# Patient Record
Sex: Female | Born: 1970 | Race: White | Hispanic: No | Marital: Married | Smoking: Former smoker
Health system: Southern US, Academic
[De-identification: ages and names within clinical notes are randomized; demographics above are authoritative.]

## PROBLEM LIST (undated history)

## (undated) DIAGNOSIS — F419 Anxiety disorder, unspecified: Secondary | ICD-10-CM

## (undated) DIAGNOSIS — E509 Vitamin A deficiency, unspecified: Secondary | ICD-10-CM

## (undated) DIAGNOSIS — J309 Allergic rhinitis, unspecified: Secondary | ICD-10-CM

## (undated) DIAGNOSIS — I10 Essential (primary) hypertension: Secondary | ICD-10-CM

## (undated) DIAGNOSIS — Z889 Allergy status to unspecified drugs, medicaments and biological substances status: Secondary | ICD-10-CM

## (undated) DIAGNOSIS — F329 Major depressive disorder, single episode, unspecified: Secondary | ICD-10-CM

## (undated) DIAGNOSIS — G43909 Migraine, unspecified, not intractable, without status migrainosus: Secondary | ICD-10-CM

## (undated) DIAGNOSIS — F32A Depression, unspecified: Secondary | ICD-10-CM

## (undated) DIAGNOSIS — E785 Hyperlipidemia, unspecified: Secondary | ICD-10-CM

## (undated) HISTORY — DX: Vitamin a deficiency, unspecified: E50.9

## (undated) HISTORY — DX: Essential (primary) hypertension: I10

## (undated) HISTORY — DX: Hyperlipidemia, unspecified: E78.5

## (undated) HISTORY — DX: Depression, unspecified: F32.A

## (undated) HISTORY — DX: Allergy status to unspecified drugs, medicaments and biological substances: Z88.9

## (undated) HISTORY — DX: Migraine, unspecified, not intractable, without status migrainosus: G43.909

## (undated) HISTORY — PX: HX TUBAL LIGATION: SHX77

## (undated) HISTORY — PX: HX APPENDECTOMY: SHX54

## (undated) HISTORY — PX: HX TONSILLECTOMY: SHX27

## (undated) HISTORY — DX: Allergic rhinitis, unspecified: J30.9

## (undated) HISTORY — DX: Anxiety disorder, unspecified: F41.9

---
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
## 2000-12-05 ENCOUNTER — Inpatient Hospital Stay (HOSPITAL_COMMUNITY): Payer: Self-pay

## 2014-11-17 DIAGNOSIS — E559 Vitamin D deficiency, unspecified: Secondary | ICD-10-CM | POA: Insufficient documentation

## 2014-12-24 DIAGNOSIS — F411 Generalized anxiety disorder: Secondary | ICD-10-CM | POA: Insufficient documentation

## 2015-08-17 DIAGNOSIS — F17200 Nicotine dependence, unspecified, uncomplicated: Secondary | ICD-10-CM | POA: Insufficient documentation

## 2015-08-17 DIAGNOSIS — J309 Allergic rhinitis, unspecified: Secondary | ICD-10-CM | POA: Insufficient documentation

## 2015-12-11 DIAGNOSIS — R059 Cough, unspecified: Secondary | ICD-10-CM | POA: Insufficient documentation

## 2016-05-22 DIAGNOSIS — A6920 Lyme disease, unspecified: Secondary | ICD-10-CM | POA: Insufficient documentation

## 2018-06-15 DIAGNOSIS — J22 Unspecified acute lower respiratory infection: Secondary | ICD-10-CM | POA: Insufficient documentation

## 2018-09-17 ENCOUNTER — Ambulatory Visit (HOSPITAL_BASED_OUTPATIENT_CLINIC_OR_DEPARTMENT_OTHER): Payer: Self-pay | Admitting: Rheumatology

## 2018-09-17 ENCOUNTER — Encounter (HOSPITAL_BASED_OUTPATIENT_CLINIC_OR_DEPARTMENT_OTHER): Payer: Self-pay

## 2018-09-23 ENCOUNTER — Encounter (INDEPENDENT_AMBULATORY_CARE_PROVIDER_SITE_OTHER): Payer: Self-pay

## 2018-12-26 DIAGNOSIS — R5383 Other fatigue: Secondary | ICD-10-CM | POA: Insufficient documentation

## 2018-12-26 DIAGNOSIS — E785 Hyperlipidemia, unspecified: Secondary | ICD-10-CM | POA: Insufficient documentation

## 2018-12-30 ENCOUNTER — Encounter (INDEPENDENT_AMBULATORY_CARE_PROVIDER_SITE_OTHER): Payer: Self-pay

## 2019-05-14 DIAGNOSIS — M545 Low back pain, unspecified: Secondary | ICD-10-CM | POA: Insufficient documentation

## 2019-07-24 ENCOUNTER — Other Ambulatory Visit (HOSPITAL_COMMUNITY): Payer: Self-pay | Admitting: Family Medicine

## 2019-07-30 ENCOUNTER — Encounter (INDEPENDENT_AMBULATORY_CARE_PROVIDER_SITE_OTHER): Payer: Self-pay | Admitting: NURSE PRACTITIONER-ADULT HEALTH

## 2019-07-30 DIAGNOSIS — F32A Depression, unspecified: Secondary | ICD-10-CM | POA: Insufficient documentation

## 2019-07-30 DIAGNOSIS — F419 Anxiety disorder, unspecified: Secondary | ICD-10-CM | POA: Insufficient documentation

## 2019-07-30 DIAGNOSIS — F329 Major depressive disorder, single episode, unspecified: Secondary | ICD-10-CM | POA: Insufficient documentation

## 2019-08-07 ENCOUNTER — Encounter (HOSPITAL_BASED_OUTPATIENT_CLINIC_OR_DEPARTMENT_OTHER): Payer: Self-pay | Admitting: Rheumatology

## 2019-09-15 ENCOUNTER — Encounter (INDEPENDENT_AMBULATORY_CARE_PROVIDER_SITE_OTHER): Payer: Self-pay | Admitting: NURSE PRACTITIONER-ADULT HEALTH

## 2019-09-29 ENCOUNTER — Encounter (HOSPITAL_BASED_OUTPATIENT_CLINIC_OR_DEPARTMENT_OTHER): Payer: Self-pay | Admitting: Rheumatology

## 2019-10-30 ENCOUNTER — Encounter (INDEPENDENT_AMBULATORY_CARE_PROVIDER_SITE_OTHER): Payer: Self-pay | Admitting: NURSE PRACTITIONER-ADULT HEALTH

## 2019-11-06 ENCOUNTER — Other Ambulatory Visit: Payer: Self-pay

## 2019-11-06 ENCOUNTER — Ambulatory Visit: Payer: No Typology Code available for payment source | Attending: Rheumatology | Admitting: Rheumatology

## 2019-11-06 ENCOUNTER — Encounter (HOSPITAL_BASED_OUTPATIENT_CLINIC_OR_DEPARTMENT_OTHER): Payer: Self-pay | Admitting: Rheumatology

## 2019-11-06 VITALS — BP 117/68 | HR 104 | Ht 60.0 in | Wt 170.4 lb

## 2019-11-06 DIAGNOSIS — Z79899 Other long term (current) drug therapy: Secondary | ICD-10-CM | POA: Insufficient documentation

## 2019-11-06 DIAGNOSIS — M797 Fibromyalgia: Secondary | ICD-10-CM | POA: Insufficient documentation

## 2019-11-06 DIAGNOSIS — M1990 Unspecified osteoarthritis, unspecified site: Secondary | ICD-10-CM | POA: Insufficient documentation

## 2019-11-06 DIAGNOSIS — M2550 Pain in unspecified joint: Secondary | ICD-10-CM

## 2019-11-06 DIAGNOSIS — R5383 Other fatigue: Secondary | ICD-10-CM

## 2019-11-06 NOTE — Nursing Note (Signed)
Meds checked with patients list and confirmed correct pharmacy with patient for e-scribing.

## 2019-11-06 NOTE — Patient Instructions (Addendum)
Labs today.   X-rays of hands and feet today.   Follow up in one week with phone visit.

## 2019-11-06 NOTE — H&P (Signed)
[facility]  Telemedicine [place]  [address]  [place],  [place] [address]      Margaret Strickland, 49 y.o. female  Date of Service: 11/06/2019  Date of Birth:  [birth year]   PCP: Dr. Alford Highland    Reason for Consult: Fibromyalgia and joint pain    Subjective    Margaret Strickland is a pleasant  49 y.o. year old female who presents for to the telemedicine clinic today as a new referral for fibromyalgia and worsening joint pain. Patient has a history of hypertension, hyperlipidemia, vitamin D deficiency, migraines, anxiety and depression. She states that she has had worsening joint, bone and leg pain that she describes as deep and throbbing in nature over the last few years. She states that her leg pain is worse at night. She feels sore to touch all over. Patient states that she has chronic fatigue and feels very tired during the day. Every few months she states that she feels like she has the flu and states that she at times runs a low grade fever. She suffers from frequent headaches and chronic low back pain. She has had the leg pain since her early 20's, but after having her daughter at 49 years old the pain became worse. She follows Dr. Yvone Neu for ob\gyn, she states that she had early menopause. She sates that she did have a slightly elevated uric acid in the past but has never had gout that she knows of. She has a history of anxiety and depression since childhood and follows closely follows with Dr. Zella Ball. She was diagnosed with fibromyalgia and she states that she has been maintained on Cymbalta 30 mg for sometime now and when trying to increase to 60 mg she became too sleepy. She states that she has gained  approximatly 60 lbs since starting the Amitriptyline and Cymbalta. She sates that she has been referred for sleep study due to chronic fatigue.     Labs reviewed from [facility] 06/17/19  Sed rate  8  Free T4 0.92  TSH 1.44  Vitamin B12  476  Magnesium 1.9  Uric acid  6.1  ANA direct Negative  CRP 2  RA latex turbid  <10    Current Outpatient Medications   Medication Sig   . amitriptyline (ELAVIL) 25 mg Oral Tablet Take 25 mg by mouth Every night   . atorvastatin (LIPITOR) 40 mg Oral Tablet Take 40 mg by mouth Once a day   . calcium carb/mag carb/folic ac (MAGNESIUM-CALCIUM-FOLIC ACID ORAL) magnesium Take No date recorded No form recorded No frequency recorded No route recorded No set duration recorded No set duration amount recorded active No dosage strength recorded No dosage strength units of measure recorded   . cetirizine (ZYRTEC) 10 mg Oral Tablet Take 10 mg by mouth Once a day   . clonazePAM (KLONOPIN) 1 mg Oral Tablet Take 1 mg by mouth Twice daily   . duloxetine HCl (CYMBALTA ORAL) Take 30 mg by mouth Every night   . ergocalciferol, vitamin D2, (VITAMIN D2 ORAL) Take 2,000 Units by mouth Once a day   . KRILL OIL ORAL krill oil Take No date recorded No form recorded No frequency recorded No route recorded No set duration recorded No set duration amount recorded active No dosage strength recorded No dosage strength units of measure recorded   . labetaloL (NORMODYNE) 300 mg Oral Tablet Take 300 mg by mouth Twice daily   . montelukast (  SINGULAIR) 10 mg Oral Tablet Take 10 mg by mouth Every night       Family history, medical history and surgical history reviewed in the system and updated to reflect any changes since last visit    No visits with results within 4 Month(s) from this visit.   Latest known visit with results is:   No results found for any previous visit.                                                                   Imaging Studies: recent images reviewed    REVIEW OF SYSTEMS     Constitutional: denies anorexia, reports fatigue, denies fevers, denies chills, night sweats, reports weight loss, denies weight gain.  Eyes: denies blurry vision, double vision, or eye pain. Wears glasses.  Possibility for macular degeneration.   ENT:  denies difficulty swallowing, epistaxis, nasal discharge, oral lesions, tinnitus, or vocal changes.  Cardiovascular: denies chest pain, chest pressure/discomfort, irregular heartbeat, denies lower extremity edema, denies orthopnea, denies palpitations, denies syncope.  Respiratory: denies dry cough, denies dyspnea on exertion, denies emphysema, pleurisy/chest pain, sputum, or wheezing.  Gastrointestinal: denies abdominal pain, constipation, diarrhea, jaundice, melena, nausea, reflux symptoms, or vomiting. Lactose intolerant.   Musculoskeletal: denies arthralgias, denies generalized muscle aches.   Neurological: denies dizziness with position changes, positive for headache she takes excedrine migrain, denies gait problems, memory problems, speech problems, tremors, vertigo, or weakness.   Endocrine: denies hot flashes, mood swings, skin changes, temperature intolerance, or unexpected weight changes, denies change in appetite, denies sweating.   Hematologic/Lymphatic: denies bleeding problems, denies easy bruising.   Allergic/Immunological: denies hives, insect bite sensitivity, or nasal congestion.  Dermatological: denies acne, eczema, lumps, rash, or skin lesion changes.  Genitourinary: denies any urinary urgency, denies incontinence, denies blood in urine.   Psychiatric: positive anxiety, positive depression.      Recent infections or hospitalizations since last visit:     Tolerating medications well.     Current Outpatient Medications   Medication Sig   . amitriptyline (ELAVIL) 25 mg Oral Tablet Take 25 mg by mouth Every night   . atorvastatin (LIPITOR) 40 mg Oral Tablet Take 40 mg by mouth Once a day   . calcium carb/mag carb/folic ac (MAGNESIUM-CALCIUM-FOLIC ACID ORAL) magnesium Take No date recorded No form recorded No frequency recorded No route recorded No set duration recorded No set duration amount recorded active No dosage strength recorded No dosage strength units of measure recorded   . cetirizine  (ZYRTEC) 10 mg Oral Tablet Take 10 mg by mouth Once a day   . clonazePAM (KLONOPIN) 1 mg Oral Tablet Take 1 mg by mouth Twice daily   . duloxetine HCl (CYMBALTA ORAL) Take 30 mg by mouth Every night   . ergocalciferol, vitamin D2, (VITAMIN D2 ORAL) Take 2,000 Units by mouth Once a day   . KRILL OIL ORAL krill oil Take No date recorded No form recorded No frequency recorded No route recorded No set duration recorded No set duration amount recorded active No dosage strength recorded No dosage strength units of measure recorded   . labetaloL (NORMODYNE) 300 mg Oral Tablet Take 300 mg by mouth Twice daily   . montelukast (SINGULAIR) 10 mg Oral Tablet Take 10 mg  by mouth Every night       Family history, medical history and surgical history reviewed in the system and updated to reflect any changes since last visit    Objective:  BP 117/68   Pulse (!) 104   Ht 1.524 m (5')   Wt 77.3 kg (170 lb 6.4 oz)   SpO2 95%   BMI 33.28 kg/m     Physical Exam:  Constitutional: AAOx3, NAD  HEENT: Normocephalic, atraumatic. PERRLA.   Neck: Trachea midline, supple  Cardiovascular: RRR; No murmurs, rubs, or gallops present. S1, S2 normal.  Pulmonary: Lungs CTA bilaterally; No wheezes, rales, or rhonchi observed. Normal respiratory effort. No retractions.  Abdomen: Abdomen soft and supple; No tenderness. Non-distended; No masses or HSM present.   Extremities: No peripheral edema; No cyanosis or clubbing of nails.  Musculoskeletal: Normal muscle strength and tone of all four extremities. Patient is able to make fist bilaterally, raise arms above head, stand from a sitting position.   Skin: No rashes or lesions present; Warm and dry. No jaundice.  Psychiatric: Normal mood and affect; pleasant.   Morning stiffness/swelling: morning stiffness last worse but comes and goes throughout the day.     Best Lynne Logan part of day: Sits in a chair all day.     Alleviating factors: hot bath/shower, heating blanket     Aggravating factors: weather  makes worse.     Sleep Study: has a referral for one     Prednisone use Margaret Strickland: n/a     Assessment:     1. Fibromyalgia   2. Osteoarthritis pain    PLAN:  1. Labs from [facility] reviewed from 05/14/2018, most recent labs requested.    2. Labs and x-rays of bilateral hands and feet ordered, patient wishes to wait until labs from [facility] are received and reviewed before repeating.     Follow Up:  Will follow up in one week via phone with Dr. Barbaraann Rondo     TELEMEDICINE DOCUMENTATION:    Patient Location:  Specialty [address], [place], [place] [address]    Patient/family aware of provider location:  yes  Patient/family consent for telemedicine:  yes  Examination observed and performed by:  Violeta Gelinas, APRN    This patient was seen in the [facility] with Violeta Gelinas, APRN performing assessment and charting. Dr. Barbaraann Rondo participating in assessment and plan of patient.     Dr. Barbaraann Rondo  Department of Medicine, Section of Rheumatology  [place], [facility]  [place]  I personally participated and evaluated the patient as part of a collaborative telemedicine service.  See Margaret Strickland's note for additional information.  My findings from this visit are  With neg RF and ANA  Symptoms more likely related to non inflammatory process.    Gerald Leitz, MD

## 2019-11-07 ENCOUNTER — Encounter (HOSPITAL_BASED_OUTPATIENT_CLINIC_OR_DEPARTMENT_OTHER): Payer: Self-pay | Admitting: Rheumatology

## 2019-11-07 NOTE — Telephone Encounter (Signed)
Regarding: RE: Visit Follow-Up Question  Contact: [phone]  I have no problem with returning to the clinic.  I need a 2 week notice for my employer and a evening appointment if possible.

## 2019-11-07 NOTE — Telephone Encounter (Signed)
-----   Message from Silvano Rusk sent at 11/07/2019  9:48 AM EST -----  Regarding: RE: Visit Follow-Up Question  Contact: [phone]  Dr. Tenna Child told me yesterday we could follow up and make a plan by phone consult.  Can you please check this for me.

## 2019-11-11 NOTE — Telephone Encounter (Signed)
Regarding: FW: Visit Follow-Up Question  Contact: [phone]    ----- Message -----  From: Patra Gherardi Dahlem  Sent: 11/10/2019   8:53 PM EST  To: Rhe[facility] Nurses  Subject: RE: Visit Follow-Up Question                     Thank you so much.  Does she want me to have any of the labs or x-rays.

## 2019-11-13 ENCOUNTER — Encounter (HOSPITAL_BASED_OUTPATIENT_CLINIC_OR_DEPARTMENT_OTHER): Payer: Self-pay | Admitting: Rheumatology

## 2019-11-27 ENCOUNTER — Encounter (HOSPITAL_BASED_OUTPATIENT_CLINIC_OR_DEPARTMENT_OTHER): Payer: Self-pay | Admitting: Rheumatology

## 2019-11-28 NOTE — Telephone Encounter (Signed)
-----   Message from Ennis Forts, RN sent at 11/27/2019  3:54 PM EST -----  Regarding: FW: Visit Follow-Up Question  Contact: [phone]    ----- Message -----  From: Margaret Strickland  Sent: 11/27/2019   3:51 PM EST  To: Rheum Poc Nurses  Subject: Visit Follow-Up Question                         Will I be having my phone appointment today?

## 2019-11-28 NOTE — Telephone Encounter (Signed)
Appt was set up as video so I waited for her to connect.  Apologize for the scheduling error  Can reschedule for phone call

## 2019-12-01 ENCOUNTER — Ambulatory Visit (HOSPITAL_BASED_OUTPATIENT_CLINIC_OR_DEPARTMENT_OTHER): Payer: Self-pay | Admitting: Rheumatology

## 2019-12-01 NOTE — Telephone Encounter (Signed)
Regarding: Dr.Hornsby  ----- Message from Sharrell Ku sent at 12/01/2019  9:39 AM EST -----  Rheumatology Clinic, [facility]  [place], Brett Albino, MD  RHEUMATOLOGY        Kennyth Arnold called stating that the pt was supposed to have a virtual visit with Dr , but she never got connected so the pt has been trying to find out what happened to her appointment. And wants to know if she is going     to be rescheduled with Dr please call pt to advise.

## 2019-12-03 NOTE — Telephone Encounter (Signed)
Attempted to reach the pt. Line was busy.  Axel Filler, MA  12/03/2019, 14:01

## 2019-12-03 NOTE — Telephone Encounter (Signed)
The appt was set up as a video visit and pt never connected.  She is now set up as a phone return- that is fine if she is agreeable  Please let her know i did not call her because i expected her to connect via video but will call her on the 18th for scheduled appt

## 2019-12-11 ENCOUNTER — Ambulatory Visit: Payer: Commercial Managed Care - PPO | Attending: Rheumatology | Admitting: Rheumatology

## 2019-12-11 DIAGNOSIS — M2550 Pain in unspecified joint: Secondary | ICD-10-CM

## 2019-12-11 DIAGNOSIS — M25571 Pain in right ankle and joints of right foot: Secondary | ICD-10-CM

## 2019-12-11 DIAGNOSIS — M25572 Pain in left ankle and joints of left foot: Secondary | ICD-10-CM

## 2019-12-11 DIAGNOSIS — M25541 Pain in joints of right hand: Secondary | ICD-10-CM

## 2019-12-11 DIAGNOSIS — M25542 Pain in joints of left hand: Secondary | ICD-10-CM

## 2019-12-12 ENCOUNTER — Encounter (HOSPITAL_BASED_OUTPATIENT_CLINIC_OR_DEPARTMENT_OTHER): Payer: Self-pay | Admitting: Rheumatology

## 2019-12-12 NOTE — Nursing Note (Signed)
Signed lab orders mailed to patient's home address per Dr Mackie Pai instruction. Ennis Forts, RN  12/12/2019, 12:11

## 2019-12-19 NOTE — Progress Notes (Signed)
RHEUMATOLOGY CLINIC, [facility]  [address]  [place] [address]  Operated by [facility]  Telephone Visit    Name:  Persais Ethridge MRN: [id number]   Date:  12/11/2019 Age:   49 y.o.     The patient/family initiated a request for telephone service.  Verbal consent for this service was obtained from the patient/family.    Last office visit in this department: Visit date not found      Reason for call: f/u  Call notes:  Patient is seen by video in [facility] clinic.  She wanted to review her records including labs and testing before we did any further workup.  No new symptoms.  Labs from 06/17/2019 were reviewed.  White count 6.9.  Hemoglobin 14. Platelet count 324000. Sed rate was 6 millimeters/hour.  Free T4 was 0.92.  Vitamin B12 was 476. Uric acid was 6.1.  Direct ANA was negative.  CRP was 2 with a normal range of 0-10.  Rheumatoid factor was negative.  Based on labs I do not find any clear evidence of an inflammatory arthritis.  Patient would like to pursue further evaluation and will mail her an order for hand and foot x-rays and we can follow up after those.      ICD-10-CM    1. Arthralgia, unspecified joint  M25.50 XR HANDS BILATERAL 2 VIEW     XR FEET BILATERAL       Total provider time spent with the patient on the phone: 5 minutes.    Alwyn Pea, MD

## 2020-01-07 ENCOUNTER — Encounter (HOSPITAL_BASED_OUTPATIENT_CLINIC_OR_DEPARTMENT_OTHER): Payer: Self-pay | Admitting: Rheumatology

## 2020-02-05 ENCOUNTER — Ambulatory Visit (HOSPITAL_BASED_OUTPATIENT_CLINIC_OR_DEPARTMENT_OTHER): Payer: Self-pay | Admitting: Rheumatology

## 2020-02-05 NOTE — Nursing Note (Signed)
Office notes from visits 11-06-19 and 12-11-19 faxed to the office of Vernelle Emerald, FNP at [phone].  Honor Loh, LPN  6/00/4599, 10:32

## 2020-02-05 NOTE — Telephone Encounter (Signed)
Regarding: office notes reqeust  ----- Message from Thurmond Butts sent at 02/05/2020 10:11 AM EDT -----  Alwyn Pea, MD    Leta Jungling from Vernelle Emerald, FNP,  office is asking for Office notes from January 2021 to present. Please fax to [phone]. Thank you.

## 2021-01-25 LAB — LAB: COLOGUARD RESULT: NEGATIVE

## 2021-01-25 LAB — COLOGUARD® COLON CANCER SCREEN: COLOGUARD RESULT: NEGATIVE

## 2022-02-06 ENCOUNTER — Encounter (HOSPITAL_BASED_OUTPATIENT_CLINIC_OR_DEPARTMENT_OTHER): Payer: Self-pay | Admitting: Pediatric Allergy/Immunology

## 2022-02-06 ENCOUNTER — Other Ambulatory Visit: Payer: Self-pay

## 2022-02-06 ENCOUNTER — Ambulatory Visit: Payer: BC Managed Care – PPO | Attending: Pediatric Allergy/Immunology | Admitting: Pediatric Allergy/Immunology

## 2022-02-06 VITALS — BP 128/88 | Temp 97.6°F | Ht 60.0 in | Wt 169.1 lb

## 2022-02-06 DIAGNOSIS — D801 Nonfamilial hypogammaglobulinemia: Secondary | ICD-10-CM | POA: Insufficient documentation

## 2022-02-06 NOTE — Progress Notes (Signed)
ALLERGY/IMMUNOLOGY, [place] [address]  Office Visit    Name: Margaret Strickland   DOB: [birth year]  Date of Service: 02/06/2022     Chief Complaint(s):   Chief Complaint   Patient presents with   . Immunodefiency       Subjective:  Patient denies recurrent bronchitis or pneumonia, she does however note that she gets 3-4 sinus infections a year.  Patient states in general she waits to the last minute to see the doctor for antibiotics and often times needs more than 1 course to clear the infection.  Patient denies that she has had sinus surgery in the past.  Patient also notes that she has Fatigued, muscle aches and joint pains.     ROS:  Constitutional: Denies fevers, chills, night sweats. No recent weight changes or fatigue.   Eyes: Denies irritation, erythema, discharge or pain.   Ears: Denies ear pain, or discharge.  Nose/Sinus:  Denies sinus pressure and pain at today's visit  Mouth/Throat: Denies any oral ulcers or other lesions.   Cardiovascular: Denies any chest pain, palpitations, PND or DOE  Respiratory: Denies any shortness of breath, wheezing, cough   GI: Denies any abdominal pain, nausea, vomiting, diarrhea or constipation. No melena or hematochezia.  Musculoskeletal: see HPI  Neurological:  Denies any dizziness or headaches.  Emotional/Psychiatric: Denies depression or anxiety.  Skin: Denies any recent rashes or lesions.   Hem/lymphaptics:  Denies: bruising, bleeding, increased joint or bone pain, fevers  Infection/Immune: Infection/Immune: see HPI    Past Medical History:  Patient Active Problem List    Diagnosis Date Noted   . Anxiety 07/30/2019   . Depression 07/30/2019   . Chronic low back pain 05/14/2019   . Fatigue 12/26/2018   . Hyperlipidemia 12/26/2018   . Lower respiratory tract infection 06/15/2018   . Lyme disease 05/22/2016   . Cough 12/11/2015   . Allergic rhinitis 08/17/2015   . Generalized anxiety disorder 12/24/2014   . Vitamin D deficiency  11/17/2014   . Pain in limb 02/22/2007   . Encounter for long-term (current) use of insulin (CMS [place]) 04/30/2006   . Migraine without aura 04/30/2006   . Dyspnea 03/27/2006   . Benign essential hypertension 12/01/2004   . Lumbar spondylosis with myelopathy 05/02/2004   . Panic disorder without agoraphobia 01/25/2004   . Depression 12/06/2000       Family History:  Family History   Problem Relation Age of Onset   . Heart Attack Mother    . Allergic rhinitis Mother    . Food Allergy Mother    . COPD Mother    . Hypertension (High Blood Pressure) Father    . Depression Father    . Hypertension (High Blood Pressure) Sister    . Asthma Sister    . Allergic rhinitis Sister    . Drug Allergy Sister    . COPD Sister    . Hypertension (High Blood Pressure) Brother    . Heart Attack Maternal Grandmother    Sister has hypogam and is on IVIG.    Medications:  Current Outpatient Medications   Medication Sig   . amitriptyline (ELAVIL) 25 mg Oral Tablet Take 2 Tablets (50 mg total) by mouth Every night   . atorvastatin (LIPITOR) 40 mg Oral Tablet Take 1 Tablet (40 mg total) by mouth Once a day   . calcium carb/mag carb/folic ac (MAGNESIUM-CALCIUM-FOLIC ACID ORAL) magnesium Take No date recorded No form recorded  No frequency recorded No route recorded No set duration recorded No set duration amount recorded active No dosage strength recorded No dosage strength units of measure recorded (Patient not taking: Reported on 02/06/2022)   . CALCIUM-MAGNESIUM-ZINC ORAL Take by mouth Twice daily   . cetirizine (ZYRTEC) 10 mg Oral Tablet Take 1 Tablet (10 mg total) by mouth Once a day   . clonazePAM (KLONOPIN) 1 mg Oral Tablet Take 1 Tablet (1 mg total) by mouth Twice daily   . desvenlafaxine (PRISTIQ) 100 mg Oral Tablet Sustained Release 24 hr Take 1 Tablet (100 mg total) by mouth Once a day   . ergocalciferol, vitamin D2, (VITAMIN D2 ORAL) Take 2,000 Units by mouth Once a day   . KRILL OIL ORAL krill oil Take No date recorded No form  recorded No frequency recorded No route recorded No set duration recorded No set duration amount recorded active No dosage strength recorded No dosage strength units of measure recorded   . labetaloL (NORMODYNE) 300 mg Oral Tablet Take 1 Tablet (300 mg total) by mouth Twice daily   . montelukast (SINGULAIR) 10 mg Oral Tablet Take 1 Tablet (10 mg total) by mouth Every night   . omeprazole (PRILOSEC) 20 mg Oral Capsule, Delayed Release(E.C.) Take 1 Capsule (20 mg total) by mouth Once a day        Allergies:  Allergies   Allergen Reactions   . Carbamazepine    . Vortioxetine  Other Adverse Reaction (Add comment)        Social History:  Social History     Tobacco Use   . Smoking status: Former     Packs/day: 1.00     Years: 20.00     Pack years: 20.00     Types: Cigarettes   . Smokeless tobacco: Never          Objective:  BP 128/88 (Site: Right, Patient Position: Standing)   Temp 36.4 C (97.6 F) (Temporal)   Ht 1.524 m (5')   Wt 76.7 kg (169 lb 1.5 oz)   BMI 33.02 kg/m          Physical Exam:  General: no acute distress  Eyes: EOMI, Conjunctiva are clear. No discharge or icterus noted.  HENT: Mucous membranes are moist. Nares are +1 turbinates, without rhinorrhea. Posterior oropharynx is without post nasal drip, no exudates.  TM cone of light seen with no pus noted B/L.    Neck: Supple, Thyroid palpated as normal  Lungs: Clear to Auscultation B/L, good air exchange  Cardiovascular: Normal rate and regular rhythm. No murmurs, rubs or gallops.  Abdomen: Soft. Non-tender. Non-distended, BS present.   Extremities: Atraumatic. No cyanosis. No significant peripheral edema.  Skin: no rashes or lesions  Neurologic: Strength and sensation grossly normal throughout. CNIII-XII intact, Normal gait.    Psychiatric: Affect within normal limits.  Musculoskeletal: Extremities in tact x 4. No joint erythema or edema.        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  CBC/DIFF   . IMMUNOGLOBULIN A (IGA), SERUM   . IMMUNOGLOBULIN G (IGG), SERUM   . IMMUNOGLOBULIN M (IGM), SERUM   . IMMUNOGLOBULIN G SUBCLASSES PANEL   . STREPTOCOCCUS PNEUMONIAE IGG ANTIBODIES, 23 SEROTYPES, SERUM   . TETANUS TOXOID IGG ANTIBODY ASSAY, SERUM   . 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  Will repeat blood work in discussed possible Pneumovax with repeat labs 4 weeks after vaccination.      Patient has recurrent sinusitis requiring prolonged antibiotic courses to clear infections.  Patient also has family history with full sibling her sister having hypogammaglobulinemia requiring IVIG.    Follow-up in 2-3 months by video or in-person, or sooner as needed    Jethro Poling, DO 02/06/2022, 21:19        Portions of this note may be dictated using voice recognition software. Variances in spelling and vocabulary are possible and unintentional. Not all errors are caught/corrected. Please notify the [facility] if any discrepancies are noted or if the meaning of any statement is not clear.

## 2022-02-06 NOTE — Patient Instructions (Addendum)
Trial stop Lipitor    Lab work today    Possible Pneumovax and repeat lab work in 4 weeks    Follow-up in 12 week

## 2022-03-29 ENCOUNTER — Ambulatory Visit (HOSPITAL_BASED_OUTPATIENT_CLINIC_OR_DEPARTMENT_OTHER): Payer: Self-pay | Admitting: Pediatric Allergy/Immunology

## 2022-04-28 ENCOUNTER — Other Ambulatory Visit: Payer: BC Managed Care – PPO | Attending: Pediatric Allergy/Immunology

## 2022-04-28 ENCOUNTER — Other Ambulatory Visit: Payer: Self-pay

## 2022-04-28 DIAGNOSIS — D801 Nonfamilial hypogammaglobulinemia: Secondary | ICD-10-CM | POA: Insufficient documentation

## 2022-04-28 LAB — CBC WITH DIFF
BASOPHIL #: 0.1 10*3/uL (ref 0.00–0.30)
BASOPHIL %: 1 % (ref 0–3)
EOSINOPHIL #: 0.1 10*3/uL (ref 0.00–0.80)
EOSINOPHIL %: 2 % (ref 0–7)
HCT: 37.1 % (ref 37.0–47.0)
HGB: 13.3 g/dL (ref 12.5–16.0)
LYMPHOCYTE #: 2.8 10*3/uL (ref 1.10–5.00)
LYMPHOCYTE %: 40 % (ref 25–45)
MCH: 32.4 pg — ABNORMAL HIGH (ref 27.0–32.0)
MCHC: 35.9 g/dL (ref 32.0–36.0)
MCV: 90.1 fL (ref 78.0–99.0)
MONOCYTE #: 0.5 10*3/uL (ref 0.00–1.30)
MONOCYTE %: 8 % (ref 0–12)
MPV: 6.5 fL — ABNORMAL LOW (ref 7.4–10.4)
NEUTROPHIL #: 3.4 10*3/uL (ref 1.80–8.40)
NEUTROPHIL %: 49 % (ref 40–76)
PLATELETS: 307 10*3/uL (ref 140–440)
RBC: 4.11 10*6/uL — ABNORMAL LOW (ref 4.20–5.40)
RDW: 13.7 % (ref 11.6–14.8)
WBC: 6.9 10*3/uL (ref 4.0–10.5)
WBCS UNCORRECTED: 6.9 10*3/uL

## 2022-05-02 LAB — TETANUS TOXOID IGG ANTIBODY ASSAY, SERUM: TETANUS ANTITOXOID: 4.43 IU/mL

## 2022-05-03 LAB — IMMUNOGLOBULIN G SUBCLASSES PANEL
IMMUNOGLOBULIN G SUBCLASS 1: 418 mg/dL (ref 382–929)
IMMUNOGLOBULIN G SUBCLASS 2: 112 mg/dL — ABNORMAL LOW (ref 241–700)
IMMUNOGLOBULIN G SUBCLASS 3: 26 mg/dL (ref 22–178)
IMMUNOGLOBULIN G SUBCLASS 4: 8 mg/dL (ref 4.0–86.0)
IMMUNOGLOBULIN G, SERUM: 621 mg/dL (ref 600–1640)

## 2022-05-05 LAB — STREPTOCOCCUS PNEUMONIAE IGG ANTIBODIES, 23 SEROTYPES, SERUM
SEROTYPE 1 (1) STREPTOCOCCUS PNEUMONIAE IGG ANTIBODIES: <0.3
SEROTYPE 12, STREPTOCOCCUS PNEUMONIAE IGG ANTIBODIES: <0.3
SEROTYPE 14 (14), STREPTOCOCCUS PNEUMONIAE IGG ANTIBODIES: 0.5
SEROTYPE 17 (17F), STREPTOCOCCUS PNEUMONIAE IGG ANTIBODIES: 0.8
SEROTYPE 19 (19F), STREPTOCOCCUS PNEUMONIAE IGG ANTIBODIES: <0.3
SEROTYPE 2 (2), STREPTOCOCCUS PNEUMONIAE IGG ANTIBODIES: 0.5
SEROTYPE 20 (20), STREPTOCOCCUS PNEUMONIAE IGG ANTIBODIES: 0.4
SEROTYPE 22 (22F), STREPTOCOCCUS PNEUMONIAE IGG ANTIBODIES: <0.3
SEROTYPE 23 (23F), STREPTOCOCCUS PNEUMONIAE IGG ANTIBODIES: <0.3
SEROTYPE 26 (6B), STREPTOCOCCUS PNEUMONIAE IGG ANTIBODIES: <0.3
SEROTYPE 3 (3), STREPTOCOCCUS PNEUMONIAE IGG ANTIBODIES: <0.3
SEROTYPE 34 (10A), STREPTOCOCCUS PNEUMONIAE IGG ANTIBODIES: <0.3
SEROTYPE 4 (4), STREPTOCOCCUS PNEUMONIAE IGG ANTIBODIES: <0.3
SEROTYPE 43 (11A), STREPTOCOCCUS PNEUMONIAE IGG ANTIBODIES: <0.3
SEROTYPE 5 (5), STREPTOCOCCUS PNEUMONIAE IGG ANTIBODIES: <0.3
SEROTYPE 51 (7F), STREPTOCOCCUS PNEUMONIAE IGG ANTIBODIES: 2
SEROTYPE 54 (15B), STREPTOCOCCUS PNEUMONIAE IGG ANTIBODIES: <0.3
SEROTYPE 56 (18C), STREPTOCOCCUS PNEUMONIAE IGG ANTIBODIES: <0.3
SEROTYPE 57 (19A), STREPTOCOCCUS PNEUMONIAE IGG ANTIBODIES: <0.3
SEROTYPE 68 (9V), STREPTOCOCCUS PNEUMONIAE IGG ANTIBODIES: <0.3
SEROTYPE 70 (33F), STREPTOCOCCUS PNEUMONIAE IGG ANTIBODIES: <0.3
SEROTYPE 8 STREPTOCOCCUS PNEUMONIAE IGG ANTIBODIES: <0.3
SEROTYPE 9, STREPTOCOCCUS PNEUMONIAE IGG ANTIBODIES: <0.3

## 2022-05-09 LAB — MANNOSE BINDING LECTIN: MANNOSE BINDING LECTIN: <40 ng/mL — ABNORMAL LOW (ref >=76)

## 2022-05-12 ENCOUNTER — Telehealth (HOSPITAL_BASED_OUTPATIENT_CLINIC_OR_DEPARTMENT_OTHER): Payer: Self-pay | Admitting: Pediatric Allergy/Immunology

## 2022-05-12 ENCOUNTER — Encounter (HOSPITAL_BASED_OUTPATIENT_CLINIC_OR_DEPARTMENT_OTHER): Payer: Self-pay | Admitting: Pediatric Allergy/Immunology

## 2022-05-12 ENCOUNTER — Ambulatory Visit: Payer: BC Managed Care – PPO | Attending: Pediatric Allergy/Immunology | Admitting: Pediatric Allergy/Immunology

## 2022-05-12 DIAGNOSIS — J329 Chronic sinusitis, unspecified: Secondary | ICD-10-CM

## 2022-05-12 DIAGNOSIS — D801 Nonfamilial hypogammaglobulinemia: Secondary | ICD-10-CM | POA: Insufficient documentation

## 2022-05-12 DIAGNOSIS — D849 Immunodeficiency, unspecified: Secondary | ICD-10-CM | POA: Insufficient documentation

## 2022-05-12 DIAGNOSIS — Q998 Other specified chromosome abnormalities: Secondary | ICD-10-CM | POA: Insufficient documentation

## 2022-05-12 MED ORDER — FLUCONAZOLE 150 MG TABLET
150.0000 mg | ORAL_TABLET | Freq: Every day | ORAL | 0 refills | Status: AC
Start: 2022-05-12 — End: 2022-05-15

## 2022-05-12 MED ORDER — DOXYCYCLINE HYCLATE 100 MG CAPSULE
100.0000 mg | ORAL_CAPSULE | Freq: Two times a day (BID) | ORAL | 1 refills | Status: AC
Start: 2022-05-12 — End: 2022-05-26

## 2022-05-12 MED ORDER — AZITHROMYCIN 250 MG TABLET
250.0000 mg | ORAL_TABLET | ORAL | 0 refills | Status: AC
Start: 2022-05-12 — End: 2022-08-10
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
## 2022-05-12 NOTE — Telephone Encounter (Addendum)
Spoke to patient to inform her the PPV23 order was faxed to [facility] in [place], [place]. She verbalized understanding.    Tyler Pita, RN      ----- Message from Audrie Lia, DO sent at 05/12/2022 10:07 AM EDT -----  Regarding: PPV23 with repeat lab work in 4-6 weeks  PPV23 with repeat lab work in 4-6 weeks.  Wants to have it sent to [facility] on [address] in [place], [place]    [email or web address]      Needs to have Immunodeficiency listed on the script.

## 2022-05-24 ENCOUNTER — Ambulatory Visit (HOSPITAL_BASED_OUTPATIENT_CLINIC_OR_DEPARTMENT_OTHER): Payer: Self-pay | Admitting: Pediatric Allergy/Immunology

## 2022-05-24 NOTE — Telephone Encounter (Addendum)
Spoke with pharmacy tech who states he did receive the fax for the PPV23 order.    Tyler Pita, RN        Regarding: script for vaccine  ----- Message from Glenford Peers sent at 05/24/2022 11:25 AM EDT -----  [facility]    Boneta Lucks from [facility] is calling needing a script for the pneumovax 23. She states they are requiring a script for that. Please call to discuss    Boneta Lucks  [phone]     Preferred Pharmacy      [id number] [place], [place] - [address] AT [facility] OF   [address]    [address] [place] [address]    Phone: [phone] Fax: [phone]    Hours: Not open 24 hours

## 2022-06-29 ENCOUNTER — Encounter (HOSPITAL_BASED_OUTPATIENT_CLINIC_OR_DEPARTMENT_OTHER): Payer: Self-pay | Admitting: Pediatric Allergy/Immunology

## 2022-07-13 ENCOUNTER — Ambulatory Visit (HOSPITAL_BASED_OUTPATIENT_CLINIC_OR_DEPARTMENT_OTHER): Payer: BC Managed Care – PPO | Admitting: Pediatric Allergy/Immunology

## 2022-07-25 ENCOUNTER — Other Ambulatory Visit: Payer: BC Managed Care – PPO | Attending: Pediatric Allergy/Immunology

## 2022-07-25 ENCOUNTER — Other Ambulatory Visit: Payer: Self-pay

## 2022-07-25 DIAGNOSIS — D801 Nonfamilial hypogammaglobulinemia: Secondary | ICD-10-CM | POA: Insufficient documentation

## 2022-07-25 DIAGNOSIS — D849 Immunodeficiency, unspecified: Secondary | ICD-10-CM | POA: Insufficient documentation

## 2022-07-29 LAB — STREPTOCOCCUS PNEUMONIAE IGG ANTIBODIES, 23 SEROTYPES, SERUM
SEROTYPE 1 (1) STREPTOCOCCUS PNEUMONIAE IGG ANTIBODIES: 0.8
SEROTYPE 12, STREPTOCOCCUS PNEUMONIAE IGG ANTIBODIES: <0.3
SEROTYPE 14 (14), STREPTOCOCCUS PNEUMONIAE IGG ANTIBODIES: 0.4
SEROTYPE 17 (17F), STREPTOCOCCUS PNEUMONIAE IGG ANTIBODIES: 5.6
SEROTYPE 19 (19F), STREPTOCOCCUS PNEUMONIAE IGG ANTIBODIES: 0.3
SEROTYPE 2 (2), STREPTOCOCCUS PNEUMONIAE IGG ANTIBODIES: 1.1
SEROTYPE 20 (20), STREPTOCOCCUS PNEUMONIAE IGG ANTIBODIES: 0.7
SEROTYPE 22 (22F), STREPTOCOCCUS PNEUMONIAE IGG ANTIBODIES: <0.3
SEROTYPE 23 (23F), STREPTOCOCCUS PNEUMONIAE IGG ANTIBODIES: <0.3
SEROTYPE 26 (6B), STREPTOCOCCUS PNEUMONIAE IGG ANTIBODIES: <0.3
SEROTYPE 3 (3), STREPTOCOCCUS PNEUMONIAE IGG ANTIBODIES: 2.8
SEROTYPE 34 (10A), STREPTOCOCCUS PNEUMONIAE IGG ANTIBODIES: <0.3
SEROTYPE 4 (4), STREPTOCOCCUS PNEUMONIAE IGG ANTIBODIES: <0.3
SEROTYPE 43 (11A), STREPTOCOCCUS PNEUMONIAE IGG ANTIBODIES: <0.3
SEROTYPE 5 (5), STREPTOCOCCUS PNEUMONIAE IGG ANTIBODIES: 0.8
SEROTYPE 51 (7F), STREPTOCOCCUS PNEUMONIAE IGG ANTIBODIES: 9.3
SEROTYPE 54 (15B), STREPTOCOCCUS PNEUMONIAE IGG ANTIBODIES: 1.8
SEROTYPE 56 (18C), STREPTOCOCCUS PNEUMONIAE IGG ANTIBODIES: 0.9
SEROTYPE 57 (19A), STREPTOCOCCUS PNEUMONIAE IGG ANTIBODIES: 0.5
SEROTYPE 68 (9V), STREPTOCOCCUS PNEUMONIAE IGG ANTIBODIES: 0.4
SEROTYPE 70 (33F), STREPTOCOCCUS PNEUMONIAE IGG ANTIBODIES: 0.5
SEROTYPE 8 STREPTOCOCCUS PNEUMONIAE IGG ANTIBODIES: 1
SEROTYPE 9, STREPTOCOCCUS PNEUMONIAE IGG ANTIBODIES: 0.5
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
## 2022-08-05 ENCOUNTER — Encounter (HOSPITAL_BASED_OUTPATIENT_CLINIC_OR_DEPARTMENT_OTHER): Payer: Self-pay | Admitting: Pediatric Allergy/Immunology

## 2022-08-24 ENCOUNTER — Ambulatory Visit: Payer: BC Managed Care – PPO | Attending: Pediatric Allergy/Immunology | Admitting: Pediatric Allergy/Immunology

## 2022-08-24 ENCOUNTER — Encounter (HOSPITAL_BASED_OUTPATIENT_CLINIC_OR_DEPARTMENT_OTHER): Payer: Self-pay | Admitting: Pediatric Allergy/Immunology

## 2022-08-24 ENCOUNTER — Encounter (INDEPENDENT_AMBULATORY_CARE_PROVIDER_SITE_OTHER): Payer: Self-pay

## 2022-08-24 DIAGNOSIS — D801 Nonfamilial hypogammaglobulinemia: Secondary | ICD-10-CM | POA: Insufficient documentation

## 2022-08-24 DIAGNOSIS — D806 Antibody deficiency with near-normal immunoglobulins or with hyperimmunoglobulinemia: Secondary | ICD-10-CM | POA: Insufficient documentation

## 2022-08-24 NOTE — Progress Notes (Signed)
[facility]  [address]  [place] [address]  Operated by [facility]  Video Visit     Name: Margaret Strickland  MRN: [id number]    Date: 08/24/2022  Age: 51 y.o.                            Patient's location: Home - [place] [address]   Patient/family aware of provider location: Yes  Patient/family consent for video visit: Yes  Interview and observation performed by: Blanca Friend, DO    Chief Complaint: Immunodefiency    History of Present Illness:  Margaret Strickland is a 51 y.o. female     Patietn notes that since her last visit she has strep throat, and fever of 99-100 for the past 2 weeks.  She has been taking Azithromycin M, W, F but did need antibiotics for strep throat.           Current Outpatient Medications   Medication Sig    amitriptyline (ELAVIL) 25 mg Oral Tablet Take 2 Tablets (50 mg total) by mouth Every night    atorvastatin (LIPITOR) 40 mg Oral Tablet Take 1 Tablet (40 mg total) by mouth Once a day    calcium carb/mag carb/folic ac (MAGNESIUM-CALCIUM-FOLIC ACID ORAL) magnesium Take No date recorded No form recorded No frequency recorded No route recorded No set duration recorded No set duration amount recorded active No dosage strength recorded No dosage strength units of measure recorded (Patient not taking: Reported on 02/06/2022)    CALCIUM-MAGNESIUM-ZINC ORAL Take by mouth Twice daily    cetirizine (ZYRTEC) 10 mg Oral Tablet Take 1 Tablet (10 mg total) by mouth Once a day    clonazePAM (KLONOPIN) 1 mg Oral Tablet Take 1 Tablet (1 mg total) by mouth Twice daily    desvenlafaxine (PRISTIQ) 100 mg Oral Tablet Sustained Release 24 hr Take 1 Tablet (100 mg total) by mouth Once a day    ergocalciferol, vitamin D2, (VITAMIN D2 ORAL) Take 2,000 Units by mouth Once a day    KRILL OIL ORAL krill oil Take No date recorded No form recorded No frequency recorded No route recorded No set duration recorded No set duration amount recorded active No dosage  strength recorded No dosage strength units of measure recorded    labetaloL (NORMODYNE) 300 mg Oral Tablet Take 1 Tablet (300 mg total) by mouth Twice daily    montelukast (SINGULAIR) 10 mg Oral Tablet Take 1 Tablet (10 mg total) by mouth Every night    omeprazole (PRILOSEC) 20 mg Oral Capsule, Delayed Release(E.C.) Take 1 Capsule (20 mg total) by mouth Once a day     Allergies   Allergen Reactions    Carbamazepine     Vortioxetine  Other Adverse Reaction (Add comment)     Past Medical History:   Diagnosis Date    Allergic rhinitis     Anxiety     Depression     Drug allergy     Essential hypertension     Hyperlipemia     Migraine     Vitamin A deficiency          Past Surgical History:   Procedure Laterality Date    HX APPENDECTOMY      HX TONSILLECTOMY      HX TUBAL LIGATION           Family Medical History:       Problem Relation (Age  of Onset)    Allergic rhinitis Mother, Sister    Asthma Sister    COPD Mother, Sister    Depression Father    Drug Allergy Sister    Food Allergy Mother    Heart Attack Mother, Maternal Grandmother    Hypertension (High Blood Pressure) Father, Sister, Brother            Social History     Socioeconomic History    Marital status: Married   Tobacco Use    Smoking status: Former     Packs/day: 1.00     Years: 20.00     Additional pack years: 0.00     Total pack years: 20.00     Types: Cigarettes    Smokeless tobacco: Never         Review of Systems:  Constitutional: see HPI  Eyes: Denies irritation, erythema, discharge or pain.   Ears: Denies any ear pain/tugging or discharge.  Nose: Denies nasal congestion, purulent rhinorrhea,  nose bleeds   Mouth/Throat: Denies any oral ulcers or other lesions.   Cardiovascular: Denies any cyanosis, murmurs, periorbital edema   Respiratory: Denies any shortness of breath, wheezing, cough   GI: Denies any abdominal pain, vomiting, diarrhea or constipation.   Skin: Denies: skin rashes or lesions    Observational Exam:   General: No acute  distress  Eyes: EOMI, Conjunctiva are clear. No discharge or icterus noted.  HENT: Mucous membranes are moist. External Nares are without crusted rhinorrhea  Lungs: symmetrical Chest rise b/l  Cardiovascular:  No cyanosis.   Skin: no rashes or lesions on face, neck lower arms or hands  Neurologic: grossly normal.  Psychiatric: Affect within normal limits.    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    Patient with hypogam. and SAD on prophylactic antibiotics Azithromycin 250mg  M, W, F. Patient high normal temp at home and continued fatigue.     Will start IVIG at [facility] x 1, then changing to home care        Follow Up:  4-6 months or sooner if needed     Jethro Poling, DO

## 2022-09-01 ENCOUNTER — Encounter (HOSPITAL_BASED_OUTPATIENT_CLINIC_OR_DEPARTMENT_OTHER): Payer: Self-pay | Admitting: Pediatric Allergy/Immunology

## 2022-09-02 ENCOUNTER — Other Ambulatory Visit (HOSPITAL_BASED_OUTPATIENT_CLINIC_OR_DEPARTMENT_OTHER): Payer: Self-pay | Admitting: Pediatric Allergy/Immunology

## 2022-09-04 ENCOUNTER — Other Ambulatory Visit (HOSPITAL_BASED_OUTPATIENT_CLINIC_OR_DEPARTMENT_OTHER): Payer: Self-pay | Admitting: Pediatric Allergy/Immunology

## 2022-09-13 ENCOUNTER — Encounter (HOSPITAL_BASED_OUTPATIENT_CLINIC_OR_DEPARTMENT_OTHER): Payer: Self-pay | Admitting: Pediatric Allergy/Immunology

## 2022-09-19 ENCOUNTER — Ambulatory Visit (HOSPITAL_BASED_OUTPATIENT_CLINIC_OR_DEPARTMENT_OTHER): Payer: Self-pay | Admitting: Pediatric Allergy/Immunology

## 2022-09-19 NOTE — Telephone Encounter (Signed)
Spoke to [place] at [facility] to inform her that the patient's IVIG has been approved. She verbalized understanding. Office note and medication list faxed to [facility] per her request at fax # [phone].     Oleh Genin, RN

## 2022-09-21 ENCOUNTER — Other Ambulatory Visit (HOSPITAL_BASED_OUTPATIENT_CLINIC_OR_DEPARTMENT_OTHER): Payer: Self-pay | Admitting: Pediatric Allergy/Immunology

## 2022-09-28 ENCOUNTER — Ambulatory Visit
Admission: RE | Admit: 2022-09-28 | Discharge: 2022-09-28 | Disposition: A | Discharge status: Home / Self Care | Payer: BC Managed Care – PPO | Source: Ambulatory Visit | Attending: Pediatric Allergy/Immunology | Admitting: Pediatric Allergy/Immunology

## 2022-09-28 ENCOUNTER — Other Ambulatory Visit: Payer: Self-pay

## 2022-09-28 VITALS — BP 150/84 | HR 77 | Temp 98.2°F | Resp 20

## 2022-09-28 DIAGNOSIS — D806 Antibody deficiency with near-normal immunoglobulins or with hyperimmunoglobulinemia: Secondary | ICD-10-CM | POA: Insufficient documentation

## 2022-09-28 DIAGNOSIS — D801 Nonfamilial hypogammaglobulinemia: Secondary | ICD-10-CM | POA: Insufficient documentation

## 2022-09-28 LAB — ALT (SGPT): ALT (SGPT): 27 U/L (ref 7–52)

## 2022-09-28 LAB — CREATININE WITH EGFR
CREATININE: 1.02 mg/dL (ref 0.60–1.30)
ESTIMATED GFR: 66 mL/min/{1.73_m2} (ref >59)

## 2022-09-28 LAB — AST (SGOT): AST (SGOT): 22 U/L (ref 13–39)

## 2022-09-28 LAB — BUN: BUN: 10 mg/dL (ref 7–25)

## 2022-09-28 LAB — C-REACTIVE PROTEIN(CRP),INFLAMMATION: C-REACTIVE PROTEIN (CRP): 0.3 mg/dL (ref 0.1–0.5)

## 2022-09-28 MED ORDER — DEXTROSE 5 % IN WATER (D5W) INTRAVENOUS SOLUTION
INTRAVENOUS | Status: DC
Start: 2022-09-28 — End: 2022-09-29
  Administered 2022-09-28: 0 g via INTRAVENOUS

## 2022-09-28 MED ORDER — ACETAMINOPHEN 325 MG TABLET
650.0000 mg | ORAL_TABLET | Freq: Once | ORAL | Status: DC | PRN
Start: 2022-09-28 — End: 2022-09-29

## 2022-09-28 MED ORDER — METHYLPREDNISOLONE SOD SUCC 125 MG SOLUTION FOR INJECTION WRAPPER
62.5000 mg | Freq: Once | INTRAVENOUS | Status: AC
Start: 2022-09-28 — End: 2022-09-28
  Administered 2022-09-28: 62.5 mg via INTRAVENOUS

## 2022-09-28 MED ORDER — ONDANSETRON HCL (PF) 4 MG/2 ML INJECTION SOLUTION
4.0000 mg | Freq: Once | INTRAMUSCULAR | Status: DC | PRN
Start: 2022-09-28 — End: 2022-09-29

## 2022-09-28 MED ORDER — DIPHENHYDRAMINE 50 MG CAPSULE
ORAL_CAPSULE | ORAL | Status: AC
Start: 2022-09-28 — End: 2022-09-28
  Filled 2022-09-28: qty 1

## 2022-09-28 MED ORDER — DIPHENHYDRAMINE 50 MG CAPSULE
50.0000 mg | ORAL_CAPSULE | Freq: Once | ORAL | Status: DC | PRN
Start: 2022-09-28 — End: 2022-09-29

## 2022-09-28 MED ORDER — EPINEPHRINE HCL (PF) 1 MG/ML (1 ML) INJECTION SOLUTION
0.5000 mg | Freq: Once | INTRAMUSCULAR | Status: DC | PRN
Start: 2022-09-28 — End: 2022-09-29

## 2022-09-28 MED ORDER — IMMUNE GLOB,GAMM(IGG)10 %-MALT-IGA OVER 50 MCG/ML INTRAVENOUS SOLUTION
400.0000 mg/kg | Freq: Once | INTRAVENOUS | Status: AC
Start: 2022-09-28 — End: 2022-09-28
  Administered 2022-09-28: 30000 mg via INTRAVENOUS
  Administered 2022-09-28: 0 mg via INTRAVENOUS
  Filled 2022-09-28: qty 300

## 2022-09-28 MED ORDER — ACETAMINOPHEN 325 MG TABLET
650.0000 mg | ORAL_TABLET | Freq: Once | ORAL | Status: AC
Start: 2022-09-28 — End: 2022-09-28
  Administered 2022-09-28: 650 mg via ORAL

## 2022-09-28 MED ORDER — DIPHENHYDRAMINE 50 MG CAPSULE
50.0000 mg | ORAL_CAPSULE | Freq: Once | ORAL | Status: AC
Start: 2022-09-28 — End: 2022-09-28
  Administered 2022-09-28: 50 mg via ORAL

## 2022-09-28 MED ORDER — DIPHENHYDRAMINE 50 MG/ML INJECTION SOLUTION
50.0000 mg | Freq: Once | INTRAMUSCULAR | Status: DC | PRN
Start: 2022-09-28 — End: 2022-09-29

## 2022-09-28 MED ORDER — HYDROCORTISONE SOD SUCCINATE 100 MG/2 ML VIAL WRAPPER
100.0000 mg | Freq: Once | INTRAMUSCULAR | Status: DC | PRN
Start: 2022-09-28 — End: 2022-09-29
  Administered 2022-09-28: 0 mg via INTRAVENOUS

## 2022-09-28 MED ORDER — HYDROCORTISONE SOD SUCCINATE 100 MG/2 ML VIAL WRAPPER
INTRAMUSCULAR | Status: AC
Start: 2022-09-28 — End: 2022-09-28
  Filled 2022-09-28: qty 2

## 2022-09-28 MED ORDER — ACETAMINOPHEN 325 MG TABLET
ORAL_TABLET | ORAL | Status: AC
Start: 2022-09-28 — End: 2022-09-28
  Filled 2022-09-28: qty 2

## 2022-09-29 ENCOUNTER — Encounter (HOSPITAL_BASED_OUTPATIENT_CLINIC_OR_DEPARTMENT_OTHER): Payer: Self-pay | Admitting: Pediatric Allergy/Immunology

## 2022-10-01 ENCOUNTER — Encounter (HOSPITAL_BASED_OUTPATIENT_CLINIC_OR_DEPARTMENT_OTHER): Payer: Self-pay | Admitting: Pediatric Allergy/Immunology

## 2022-10-06 ENCOUNTER — Other Ambulatory Visit (HOSPITAL_BASED_OUTPATIENT_CLINIC_OR_DEPARTMENT_OTHER): Payer: Self-pay | Admitting: Pediatric Allergy/Immunology

## 2022-10-06 MED ORDER — ONDANSETRON 4 MG DISINTEGRATING TABLET
4.0000 mg | ORAL_TABLET | Freq: Three times a day (TID) | ORAL | 0 refills | Status: DC | PRN
Start: 2022-10-06 — End: 2022-12-08

## 2022-10-26 ENCOUNTER — Other Ambulatory Visit: Payer: Self-pay

## 2022-10-26 ENCOUNTER — Ambulatory Visit
Admission: RE | Admit: 2022-10-26 | Discharge: 2022-10-26 | Disposition: A | Discharge status: Home / Self Care | Payer: BC Managed Care – PPO | Source: Ambulatory Visit | Attending: Pediatric Allergy/Immunology | Admitting: Pediatric Allergy/Immunology

## 2022-10-26 VITALS — BP 142/83 | HR 86 | Temp 97.3°F | Resp 18 | Ht 60.0 in | Wt 167.4 lb

## 2022-10-26 DIAGNOSIS — D801 Nonfamilial hypogammaglobulinemia: Secondary | ICD-10-CM | POA: Insufficient documentation

## 2022-10-26 DIAGNOSIS — D806 Antibody deficiency with near-normal immunoglobulins or with hyperimmunoglobulinemia: Secondary | ICD-10-CM | POA: Insufficient documentation

## 2022-10-26 MED ORDER — IMMUNE GLOB,GAMM(IGG)10 %-MALT-IGA OVER 50 MCG/ML INTRAVENOUS SOLUTION
400.0000 mg/kg | Freq: Once | INTRAVENOUS | Status: AC
Start: 2022-10-26 — End: 2022-10-26
  Administered 2022-10-26: 0 mg via INTRAVENOUS
  Administered 2022-10-26: 30000 mg via INTRAVENOUS
  Filled 2022-10-26: qty 300

## 2022-10-26 MED ORDER — ACETAMINOPHEN 325 MG TABLET
650.0000 mg | ORAL_TABLET | Freq: Once | ORAL | Status: AC
Start: 2022-10-26 — End: 2022-10-26
  Administered 2022-10-26: 650 mg via ORAL

## 2022-10-26 MED ORDER — DIPHENHYDRAMINE 50 MG/ML INJECTION SOLUTION
50.0000 mg | Freq: Once | INTRAMUSCULAR | Status: DC | PRN
Start: 2022-10-26 — End: 2022-10-27

## 2022-10-26 MED ORDER — DIPHENHYDRAMINE 50 MG CAPSULE
50.0000 mg | ORAL_CAPSULE | Freq: Once | ORAL | Status: AC
Start: 2022-10-26 — End: 2022-10-26
  Administered 2022-10-26: 50 mg via ORAL

## 2022-10-26 MED ORDER — DEXTROSE 5 % IN WATER (D5W) INTRAVENOUS SOLUTION
INTRAVENOUS | Status: AC
Start: 2022-10-26 — End: ?
  Administered 2022-10-26: 0 g via INTRAVENOUS

## 2022-10-26 MED ORDER — ACETAMINOPHEN 325 MG TABLET
650.0000 mg | ORAL_TABLET | Freq: Once | ORAL | Status: DC | PRN
Start: 2022-10-26 — End: 2022-10-27

## 2022-10-26 MED ORDER — ACETAMINOPHEN 325 MG TABLET
ORAL_TABLET | ORAL | Status: AC
Start: 2022-10-26 — End: 2022-10-26
  Filled 2022-10-26: qty 2

## 2022-10-26 MED ORDER — EPINEPHRINE HCL (PF) 1 MG/ML (1 ML) INJECTION SOLUTION
0.5000 mg | Freq: Once | INTRAMUSCULAR | Status: DC | PRN
Start: 2022-10-26 — End: 2022-10-27

## 2022-10-26 MED ORDER — ONDANSETRON HCL (PF) 4 MG/2 ML INJECTION SOLUTION
4.0000 mg | Freq: Once | INTRAMUSCULAR | Status: DC | PRN
Start: 2022-10-26 — End: 2022-10-27

## 2022-10-26 MED ORDER — HYDROCORTISONE SOD SUCCINATE 100 MG/2 ML VIAL WRAPPER
100.0000 mg | Freq: Once | INTRAMUSCULAR | Status: DC | PRN
Start: 2022-10-26 — End: 2022-10-27

## 2022-10-26 MED ORDER — METHYLPREDNISOLONE SOD SUCC 125 MG SOLUTION FOR INJECTION WRAPPER
62.5000 mg | Freq: Once | INTRAVENOUS | Status: AC
Start: 2022-10-26 — End: 2022-10-26
  Administered 2022-10-26: 62.5 mg via INTRAVENOUS

## 2022-10-26 MED ORDER — SODIUM CHLORIDE 0.9 % IV BOLUS
250.0000 mL | INJECTION | Freq: Once | Status: AC
Start: 2022-10-26 — End: 2022-10-26
  Administered 2022-10-26: 0 mL via INTRAVENOUS
  Administered 2022-10-26: 250 mL via INTRAVENOUS

## 2022-10-26 MED ORDER — DIPHENHYDRAMINE 50 MG CAPSULE
50.0000 mg | ORAL_CAPSULE | Freq: Once | ORAL | Status: DC | PRN
Start: 2022-10-26 — End: 2022-10-27

## 2022-10-26 MED ORDER — METHYLPREDNISOLONE SOD SUCC 125 MG SOLUTION FOR INJECTION WRAPPER
INTRAVENOUS | Status: AC
Start: 2022-10-26 — End: 2022-10-26
  Filled 2022-10-26: qty 2

## 2022-10-26 MED ORDER — DIPHENHYDRAMINE 50 MG CAPSULE
ORAL_CAPSULE | ORAL | Status: AC
Start: 2022-10-26 — End: 2022-10-26
  Filled 2022-10-26: qty 1

## 2022-10-26 NOTE — Nurses Notes (Signed)
0805-Arrived to OP ONC ambulatory.  Here for IVIG infusion.  To room 410. Judithann Sauger, RN  0830-Lying in  bed with [facility] elevated.  Awake, alert, and oriented x 3 with pleasant affect.  Pt informed this nurse this was her second treatment.  Did get a migraine and felt nauseous the day after her first treatment.  Dr. Samson Frederic ordered 257ml NS bolus for this infusion and pt was given RX for Zofran 4mg  ODT q8h PRN.  No voiced complaints of HA or nausea at present.  HRR.  Lungs clear in all fields and bases.  No swelling noted in upper or lower extremities.  No voiced complaints of pain or discomfort and no s/s/ of acute distress.  VSS. Judithann Sauger, RN  0836-Tylenol 650mg  po and Benadryl 50 mg po administered per premedication order. Judithann Sauger, RN  0845-239ml NS bolus started followed by Solu-medrol 62.5mg  IV per premedication order. Judithann Sauger, RN  0900-230ml NS bolus complete. Judithann Sauger, RN  0912-IVIG (Octagam) infusion started at this time.  Will titrate per order. Judithann Sauger, RN  1000-Remains awake but resting with no voiced complaints offered.  Tolerating infusion well. Judithann Sauger, RN  1140-IVIG infusion complete.  No s/s of infusion reactions.  VSS.  Judithann Sauger, RN  1200-Left OP ONC ambulatory with no s/s of acute distress noted. Judithann Sauger, RN

## 2022-11-23 ENCOUNTER — Ambulatory Visit (HOSPITAL_COMMUNITY): Payer: BC Managed Care – PPO

## 2022-11-30 ENCOUNTER — Ambulatory Visit (HOSPITAL_COMMUNITY): Payer: BC Managed Care – PPO

## 2022-12-01 ENCOUNTER — Ambulatory Visit (HOSPITAL_COMMUNITY): Payer: BC Managed Care – PPO

## 2022-12-04 ENCOUNTER — Ambulatory Visit (HOSPITAL_COMMUNITY): Payer: BC Managed Care – PPO

## 2022-12-08 ENCOUNTER — Other Ambulatory Visit: Payer: Self-pay

## 2022-12-08 ENCOUNTER — Encounter (HOSPITAL_BASED_OUTPATIENT_CLINIC_OR_DEPARTMENT_OTHER): Payer: Self-pay | Admitting: Pediatric Allergy/Immunology

## 2022-12-08 ENCOUNTER — Ambulatory Visit
Admission: RE | Admit: 2022-12-08 | Discharge: 2022-12-08 | Disposition: A | Discharge status: Home / Self Care | Payer: BC Managed Care – PPO | Source: Ambulatory Visit | Attending: Pediatric Allergy/Immunology | Admitting: Pediatric Allergy/Immunology

## 2022-12-08 VITALS — BP 153/99 | HR 107 | Temp 97.6°F | Resp 18

## 2022-12-08 DIAGNOSIS — D806 Antibody deficiency with near-normal immunoglobulins or with hyperimmunoglobulinemia: Secondary | ICD-10-CM

## 2022-12-08 DIAGNOSIS — D801 Nonfamilial hypogammaglobulinemia: Secondary | ICD-10-CM

## 2022-12-08 MED ORDER — SODIUM CHLORIDE 0.9 % IV BOLUS
250.0000 mL | INJECTION | Freq: Once | Status: AC
Start: 2022-12-08 — End: 2022-12-08
  Administered 2022-12-08: 250 mL via INTRAVENOUS
  Administered 2022-12-08: 0 mL via INTRAVENOUS

## 2022-12-08 MED ORDER — ONDANSETRON 4 MG DISINTEGRATING TABLET
4.0000 mg | ORAL_TABLET | Freq: Three times a day (TID) | ORAL | 2 refills | Status: AC | PRN
Start: 2022-12-08 — End: ?

## 2022-12-08 MED ORDER — DIPHENHYDRAMINE 50 MG CAPSULE
ORAL_CAPSULE | ORAL | Status: AC
Start: 2022-12-08 — End: 2022-12-08
  Filled 2022-12-08: qty 1

## 2022-12-08 MED ORDER — EPINEPHRINE HCL (PF) 1 MG/ML (1 ML) INJECTION SOLUTION
0.5000 mg | Freq: Once | INTRAMUSCULAR | Status: DC | PRN
Start: 2022-12-08 — End: 2022-12-09

## 2022-12-08 MED ORDER — ACETAMINOPHEN 325 MG TABLET
650.0000 mg | ORAL_TABLET | Freq: Once | ORAL | Status: AC
Start: 2022-12-08 — End: 2022-12-08
  Administered 2022-12-08: 650 mg via ORAL

## 2022-12-08 MED ORDER — DEXTROSE 5 % IN WATER (D5W) INTRAVENOUS SOLUTION
INTRAVENOUS | Status: DC
Start: 2022-12-08 — End: 2022-12-09
  Administered 2022-12-08: 0 g via INTRAVENOUS

## 2022-12-08 MED ORDER — IMMUNE GLOB,GAMM(IGG)10 %-MALT-IGA OVER 50 MCG/ML INTRAVENOUS SOLUTION
400.0000 mg/kg | Freq: Once | INTRAVENOUS | Status: AC
Start: 2022-12-08 — End: 2022-12-08
  Administered 2022-12-08: 0 mg via INTRAVENOUS
  Administered 2022-12-08: 30000 mg via INTRAVENOUS
  Filled 2022-12-08: qty 300

## 2022-12-08 MED ORDER — DIPHENHYDRAMINE 50 MG CAPSULE
50.0000 mg | ORAL_CAPSULE | Freq: Once | ORAL | Status: DC | PRN
Start: 2022-12-08 — End: 2022-12-09

## 2022-12-08 MED ORDER — DIPHENHYDRAMINE 50 MG/ML INJECTION SOLUTION
50.0000 mg | Freq: Once | INTRAMUSCULAR | Status: DC | PRN
Start: 2022-12-08 — End: 2022-12-09

## 2022-12-08 MED ORDER — METHYLPREDNISOLONE SOD SUCC 125 MG SOLUTION FOR INJECTION WRAPPER
INTRAVENOUS | Status: AC
Start: 2022-12-08 — End: 2022-12-08
  Filled 2022-12-08: qty 2

## 2022-12-08 MED ORDER — DIPHENHYDRAMINE 50 MG CAPSULE
50.0000 mg | ORAL_CAPSULE | Freq: Once | ORAL | Status: AC
Start: 2022-12-08 — End: 2022-12-08
  Administered 2022-12-08: 50 mg via ORAL

## 2022-12-08 MED ORDER — ACETAMINOPHEN 325 MG TABLET
ORAL_TABLET | ORAL | Status: AC
Start: 2022-12-08 — End: 2022-12-08
  Filled 2022-12-08: qty 2

## 2022-12-08 MED ORDER — ACETAMINOPHEN 325 MG TABLET
650.0000 mg | ORAL_TABLET | Freq: Once | ORAL | Status: DC | PRN
Start: 2022-12-08 — End: 2022-12-09

## 2022-12-08 MED ORDER — ONDANSETRON HCL (PF) 4 MG/2 ML INJECTION SOLUTION
4.0000 mg | Freq: Once | INTRAMUSCULAR | Status: DC | PRN
Start: 2022-12-08 — End: 2022-12-09

## 2022-12-08 MED ORDER — HYDROCORTISONE SOD SUCCINATE 100 MG/2 ML VIAL WRAPPER
100.0000 mg | Freq: Once | INTRAMUSCULAR | Status: DC | PRN
Start: 2022-12-08 — End: 2022-12-09

## 2022-12-08 MED ORDER — METHYLPREDNISOLONE SOD SUCC 125 MG SOLUTION FOR INJECTION WRAPPER
62.5000 mg | Freq: Once | INTRAVENOUS | Status: AC
Start: 2022-12-08 — End: 2022-12-08
  Administered 2022-12-08: 62.5 mg via INTRAVENOUS

## 2022-12-08 NOTE — Telephone Encounter (Addendum)
Refill Request: [facility]  Patient requests refill of Zofran.    1.  Last visit at [facility]:  02/06/2022  2.  Last My Telemed/ My Chart Visit: 08/24/2022 Sidonie Dickens, DO   3.  Next pending visit CLP: Visit date not found  4.  Was last visit within the past year: Yes  5.  If not seen within the last year was appointment scheduled: N/A  6.  Requested Pharmacy: [facility]    Medication pended to provider for approval.    Oleh Genin, RN 12/08/2022, 13:50      From: [place]  Sent: 12/08/2022  1:28 PM EST  To: [facility] Clinical Support Staff  Subject: Plan    Yes, I do need a refill.    Thanks    Stanton Kidney

## 2022-12-08 NOTE — Nurses Notes (Addendum)
0810 Patient arrived to unit for immune globulin treatment. Patient is ambulatory to department. Height and weight obtained. Judge Stall, RN  [phone] IV placed in left forearm. VSS. Lungs clear. Bowel sounds active. Abdomen soft and non-tender. No edema. Judge Stall, RN  [phone] Solu-medrol given IVP. Judge Stall, RN  [phone] Tylenol given PO. Judge Stall, RN  [phone] Benadryl given PO. Judge Stall, RN  [phone] 250 ml NS bolus started. Judge Stall, RN  [phone] NS bolus complete. Judge Stall, RN  [phone] Immune globulin infusion started. Medication will be titrated per policy. D5W infusion started at 30 mls/hr. Call light within reach. Judge Stall, RN  [phone] VS remain stable, see VS flowsheet. Judge Stall, RN  1120 Immune globulin infusion complete. Patient tolerated infusion well. VSS. IV removed and pressure dressing applied. Judge Stall, RN  [phone] Patient left unit ambulatory. Judge Stall, RN

## 2022-12-21 ENCOUNTER — Ambulatory Visit (HOSPITAL_COMMUNITY): Payer: BC Managed Care – PPO

## 2022-12-28 ENCOUNTER — Ambulatory Visit (HOSPITAL_COMMUNITY): Payer: BC Managed Care – PPO

## 2022-12-29 ENCOUNTER — Ambulatory Visit (HOSPITAL_BASED_OUTPATIENT_CLINIC_OR_DEPARTMENT_OTHER): Payer: Self-pay | Admitting: Pediatric Allergy/Immunology

## 2022-12-29 NOTE — Telephone Encounter (Addendum)
Spoke to patient regarding insurance change. Informed her that we do not have information regarding insurance and whether Dr. Samson Frederic would be in network. Informed her that she could speak to billing or her insurance and they should be able to provide her with more answers. She stated that billing couldn't help her due to her insurance not giving her the policy number yet. She states she will see Dr. Samson Frederic again prior to losing her other insurance and then she will know more once she gets the policy number from the new insurance.    Oleh Genin, RN      Regarding: ins question  ----- Message from Lujean Amel sent at 12/29/2022  1:11 PM EST -----  [facility]    Stanton Kidney calling stating that her new ins, Dr. Samson Frederic is not in network with she dont think. She called her ins and they said no but they werent sure. She was wondering if anyone at the office can find out for her for sure. Please call to discuss

## 2023-01-01 ENCOUNTER — Ambulatory Visit (HOSPITAL_COMMUNITY): Payer: BC Managed Care – PPO

## 2023-01-01 ENCOUNTER — Encounter (HOSPITAL_BASED_OUTPATIENT_CLINIC_OR_DEPARTMENT_OTHER): Payer: Self-pay | Admitting: Pediatric Allergy/Immunology

## 2023-01-05 ENCOUNTER — Ambulatory Visit
Admission: RE | Admit: 2023-01-05 | Discharge: 2023-01-05 | Disposition: A | Discharge status: Home / Self Care | Payer: BC Managed Care – PPO | Source: Ambulatory Visit | Attending: Pediatric Allergy/Immunology | Admitting: Pediatric Allergy/Immunology

## 2023-01-05 ENCOUNTER — Other Ambulatory Visit: Payer: Self-pay

## 2023-01-05 VITALS — BP 141/82 | HR 94 | Temp 97.7°F | Resp 20 | Wt 164.8 lb

## 2023-01-05 DIAGNOSIS — D801 Nonfamilial hypogammaglobulinemia: Secondary | ICD-10-CM | POA: Insufficient documentation

## 2023-01-05 DIAGNOSIS — D806 Antibody deficiency with near-normal immunoglobulins or with hyperimmunoglobulinemia: Secondary | ICD-10-CM | POA: Insufficient documentation

## 2023-01-05 LAB — AST (SGOT): AST (SGOT): 19 U/L (ref 13–39)

## 2023-01-05 LAB — CBC WITH DIFF
BASOPHIL #: 0.1 10*3/uL (ref 0.00–0.10)
BASOPHIL %: 1 % (ref 0–1)
EOSINOPHIL #: 0.1 10*3/uL (ref 0.00–0.50)
EOSINOPHIL %: 2 %
HCT: 40.2 % (ref 31.2–41.9)
HGB: 14.3 g/dL (ref 10.9–14.3)
LYMPHOCYTE #: 2.7 10*3/uL (ref 1.00–3.00)
LYMPHOCYTE %: 39 % (ref 16–44)
MCH: 32 pg (ref 24.7–32.8)
MCHC: 35.6 g/dL (ref 32.3–35.6)
MCV: 89.8 fL (ref 75.5–95.3)
MONOCYTE #: 0.5 10*3/uL (ref 0.30–1.00)
MONOCYTE %: 7 % (ref 5–13)
MPV: 7 fL — ABNORMAL LOW (ref 7.9–10.8)
NEUTROPHIL #: 3.5 10*3/uL (ref 1.85–7.80)
NEUTROPHIL %: 50 % (ref 43–77)
PLATELETS: 336 10*3/uL (ref 140–440)
RBC: 4.47 10*6/uL (ref 3.63–4.92)
RDW: 12.7 % (ref 12.3–17.7)
WBC: 6.9 10*3/uL (ref 3.8–11.8)

## 2023-01-05 LAB — CREATININE WITH EGFR
CREATININE: 0.98 mg/dL (ref 0.60–1.30)
ESTIMATED GFR: 69 mL/min/{1.73_m2} (ref >59)

## 2023-01-05 LAB — ALT (SGPT): ALT (SGPT): 27 U/L (ref 7–52)

## 2023-01-05 LAB — BUN: BUN: 9 mg/dL (ref 7–25)

## 2023-01-05 MED ORDER — IMMUNE GLOB,GAMM(IGG)10 %-MALT-IGA OVER 50 MCG/ML INTRAVENOUS SOLUTION
400.0000 mg/kg | Freq: Once | INTRAVENOUS | Status: AC
Start: 2023-01-05 — End: 2023-01-05
  Administered 2023-01-05: 0 mg via INTRAVENOUS
  Administered 2023-01-05: 30000 mg via INTRAVENOUS
  Filled 2023-01-05: qty 300

## 2023-01-05 MED ORDER — DEXTROSE 5 % IN WATER (D5W) INTRAVENOUS SOLUTION
INTRAVENOUS | Status: AC
Start: 2023-01-05 — End: ?
  Administered 2023-01-05: 0 g via INTRAVENOUS

## 2023-01-05 MED ORDER — HYDROCORTISONE SOD SUCCINATE 100 MG/2 ML VIAL WRAPPER
100.0000 mg | Freq: Once | INTRAMUSCULAR | Status: DC | PRN
Start: 2023-01-05 — End: 2023-01-06

## 2023-01-05 MED ORDER — ACETAMINOPHEN 325 MG TABLET
650.0000 mg | ORAL_TABLET | Freq: Once | ORAL | Status: DC | PRN
Start: 2023-01-05 — End: 2023-01-06

## 2023-01-05 MED ORDER — DIPHENHYDRAMINE 50 MG CAPSULE
50.0000 mg | ORAL_CAPSULE | Freq: Once | ORAL | Status: AC
Start: 2023-01-05 — End: 2023-01-05
  Administered 2023-01-05: 50 mg via ORAL

## 2023-01-05 MED ORDER — METHYLPREDNISOLONE SOD SUCC 125 MG SOLUTION FOR INJECTION WRAPPER
62.5000 mg | Freq: Once | INTRAVENOUS | Status: AC
Start: 2023-01-05 — End: 2023-01-05
  Administered 2023-01-05: 62.5 mg via INTRAVENOUS

## 2023-01-05 MED ORDER — SODIUM CHLORIDE 0.9 % IV BOLUS
250.0000 mL | INJECTION | Freq: Once | Status: AC
Start: 2023-01-05 — End: 2023-01-05
  Administered 2023-01-05: 0 mL via INTRAVENOUS
  Administered 2023-01-05: 250 mL via INTRAVENOUS

## 2023-01-05 MED ORDER — DIPHENHYDRAMINE 50 MG CAPSULE
ORAL_CAPSULE | ORAL | Status: AC
Start: 2023-01-05 — End: 2023-01-05
  Filled 2023-01-05: qty 1

## 2023-01-05 MED ORDER — METHYLPREDNISOLONE SOD SUCC 125 MG SOLUTION FOR INJECTION WRAPPER
INTRAVENOUS | Status: AC
Start: 2023-01-05 — End: 2023-01-05
  Filled 2023-01-05: qty 2

## 2023-01-05 MED ORDER — ONDANSETRON HCL (PF) 4 MG/2 ML INJECTION SOLUTION
4.0000 mg | Freq: Once | INTRAMUSCULAR | Status: DC | PRN
Start: 2023-01-05 — End: 2023-01-06

## 2023-01-05 MED ORDER — ACETAMINOPHEN 325 MG TABLET
ORAL_TABLET | ORAL | Status: AC
Start: 2023-01-05 — End: 2023-01-05
  Filled 2023-01-05: qty 2

## 2023-01-05 MED ORDER — ACETAMINOPHEN 325 MG TABLET
650.0000 mg | ORAL_TABLET | Freq: Once | ORAL | Status: AC
Start: 2023-01-05 — End: 2023-01-05
  Administered 2023-01-05: 650 mg via ORAL

## 2023-01-05 MED ORDER — DIPHENHYDRAMINE 50 MG CAPSULE
50.0000 mg | ORAL_CAPSULE | Freq: Once | ORAL | Status: DC | PRN
Start: 2023-01-05 — End: 2023-01-06

## 2023-01-05 MED ORDER — DIPHENHYDRAMINE 50 MG/ML INJECTION SOLUTION
50.0000 mg | Freq: Once | INTRAMUSCULAR | Status: DC | PRN
Start: 2023-01-05 — End: 2023-01-06

## 2023-01-05 MED ORDER — EPINEPHRINE HCL (PF) 1 MG/ML (1 ML) INJECTION SOLUTION
0.5000 mg | Freq: Once | INTRAMUSCULAR | Status: DC | PRN
Start: 2023-01-05 — End: 2023-01-06

## 2023-01-05 NOTE — Nurses Notes (Signed)
0820 - Patient ambulatory to room for Octagam infusion. Assessment complete. Lungs clear. Abdomen soft and non-tender, bowel sounds present. No edema noted. Patient complains of a nagging ear pain rated at a 1, she states she has an appointment with her PCP to have it evaluated. Shepard General, RN  [phone] - Orders released to pharmacy. Shepard General, RN  [phone] - Peripheral IV started to left hand. Patient tolerated well. Shepard General, RN  [phone] - Premedications Tylenol, Benadryl, and Solumedrol given. Shepard General, RN  [phone] - NS 274ml bolus started. Shepard General, RN  [phone] - Octagam infusion started. Titration per protocol. Shepard General, RN  1150 - Octagam infusion complete. Line flushing. Patient tolerated well. Shepard General, RN  [phone] - D5W infusion stopped. Peripheral IV removed. Gauze dressing and coban applied. Shepard General, RN  [phone] - Patient left unit via ambulation. Shepard General, RN

## 2023-01-06 LAB — C-REACTIVE PROTEIN(CRP),INFLAMMATION: CRP INFLAMMATION: 4 mg/L (ref <8.0)

## 2023-01-08 LAB — IMMUNOGLOBULIN PROFILE (IGA, IGG, AND IGM), SERUM
IMMUNOGLOBULIN A (IGA): 99 mg/dL (ref 85–499)
IMMUNOGLOBULIN G (IGG): 806 mg/dL (ref 610–1616)
IMMUNOGLOBULIN M (IGM): 64 mg/dL (ref 35–242)

## 2023-01-11 ENCOUNTER — Ambulatory Visit: Payer: BC Managed Care – PPO | Attending: Pediatric Allergy/Immunology | Admitting: Pediatric Allergy/Immunology

## 2023-01-11 ENCOUNTER — Other Ambulatory Visit (HOSPITAL_BASED_OUTPATIENT_CLINIC_OR_DEPARTMENT_OTHER): Payer: Self-pay | Admitting: Pediatric Allergy/Immunology

## 2023-01-11 DIAGNOSIS — D830 Common variable immunodeficiency with predominant abnormalities of B-cell numbers and function: Secondary | ICD-10-CM

## 2023-01-11 DIAGNOSIS — D801 Nonfamilial hypogammaglobulinemia: Secondary | ICD-10-CM

## 2023-01-11 NOTE — Progress Notes (Signed)
ALLERGY[facility]IMMUNOLOGY, [facility]  [address]  [place] [address]  Operated by [facility]  Video Visit     Name: Margaret Strickland  MRN: [id number]    Date: 01/11/2023  Age: 52 y.o.                            Patient's location: Home - [place] [address]   Patient/family aware of provider location: Yes  Patient/family consent for video visit: Yes  Interview and observation performed by: Blanca Friend, DO    Chief Complaint: Immunodefiency    History of Present Illness:  Margaret Strickland is a 52 y.o. female     Patient notes that she has had 5 IVIG infusions and she is now tolerating them better.  But she notes she did have nausea and HA's with the first 4 infusions.  Patient states that overall she has not had any infections, emergency room visits or hospitalizations for infections in the past 3 months.      Current Outpatient Medications   Medication Sig    amitriptyline (ELAVIL) 25 mg Oral Tablet Take 2 Tablets (50 mg total) by mouth Every night    atorvastatin (LIPITOR) 40 mg Oral Tablet Take 1 Tablet (40 mg total) by mouth Once a day    calcium carb/mag carb/folic ac (MAGNESIUM-CALCIUM-FOLIC ACID ORAL) magnesium Take No date recorded No form recorded No frequency recorded No route recorded No set duration recorded No set duration amount recorded active No dosage strength recorded No dosage strength units of measure recorded (Patient not taking: Reported on 02/06/2022)    CALCIUM-MAGNESIUM-ZINC ORAL Take by mouth Twice daily    cetirizine (ZYRTEC) 10 mg Oral Tablet Take 1 Tablet (10 mg total) by mouth Once a day    clonazePAM (KLONOPIN) 1 mg Oral Tablet Take 1 Tablet (1 mg total) by mouth Twice daily    desvenlafaxine (PRISTIQ) 100 mg Oral Tablet Sustained Release 24 hr Take 1 Tablet (100 mg total) by mouth Once a day    desvenlafaxine succinate (PRISTIQ) 50 mg Oral Tablet Sustained Release 24 hr Take 1 Tablet (50 mg total) by mouth Once a day    ergocalciferol, vitamin D2, (VITAMIN  D2 ORAL) Take 2,000 Units by mouth Once a day    KRILL OIL ORAL Take 1 Each by mouth Every night    labetaloL (NORMODYNE) 300 mg Oral Tablet Take 1 Tablet (300 mg total) by mouth Twice daily    montelukast (SINGULAIR) 10 mg Oral Tablet Take 1 Tablet (10 mg total) by mouth Every night    omeprazole (PRILOSEC) 20 mg Oral Capsule, Delayed Release(E.C.) Take 1 Capsule (20 mg total) by mouth Once a day    ondansetron (ZOFRAN ODT) 4 mg Oral Tablet, Rapid Dissolve Take 1 Tablet (4 mg total) by mouth Every 8 hours as needed for Nausea/Vomiting for up to 3 days after IVIG     Allergies   Allergen Reactions    Carbamazepine     Vortioxetine  Other Adverse Reaction (Add comment)     Past Medical History:   Diagnosis Date    Allergic rhinitis     Anxiety     Depression     Drug allergy     Essential hypertension     Hyperlipemia     Migraine     Vitamin A deficiency          Past Surgical History:   Procedure Laterality  Date    HX APPENDECTOMY      HX TONSILLECTOMY      HX TUBAL LIGATION           Family Medical History:       Problem Relation (Age of Onset)    Allergic rhinitis Mother, Sister    Asthma Sister    COPD Mother, Sister    Depression Father    Drug Allergy Sister    Food Allergy Mother    Heart Attack Mother, Maternal Grandmother    Hypertension (High Blood Pressure) Father, Sister, Brother            Social History     Socioeconomic History    Marital status: Married   Tobacco Use    Smoking status: Former     Current packs/day: 1.00     Average packs/day: 1 pack/day for 20.0 years (20.0 ttl pk-yrs)     Types: Cigarettes    Smokeless tobacco: Never         Review of Systems:  Constitutional: Denies fevers, chills, or fatigue.   Eyes: Denies irritation, erythema, discharge or pain.   Ears: Denies any ear pain/tugging or discharge.  Nose: Denies nasal congestion, purulent rhinorrhea,  nose bleeds   Mouth/Throat: Denies any oral ulcers or other lesions.   Cardiovascular: Denies any cyanosis, murmurs, periorbital  edema   Respiratory: Denies any shortness of breath, wheezing, cough   GI: Denies any abdominal pain, vomiting, diarrhea or constipation.   Skin: Denies: skin rashes or lesions    Observational Exam:   General: No acute distress  Eyes: EOMI, Conjunctiva are clear. No discharge or icterus noted.  HENT: Mucous membranes are moist. External Nares are without crusted rhinorrhea  Lungs: symmetrical Chest rise b/l  Cardiovascular:  No cyanosis.   Skin: no rashes or lesions on face, neck lower arms or hands  Neurologic: grossly normal.  Psychiatric: Affect within normal limits.    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    Good clinical response and tolerance to IVIG.      Continue IVIG every 4 weeks at [facility].      Discussed with the patient about the possibility of switching to subcutaneous IgG weekly.  Discussed with the patient that she can do this at any time and is completely up to her.  Discussed that with each change it can lead to delays in her infusions and can alter copays but that we can change her plan at anytime in his many times that she would like.    Follow Up:  4-6 months or sooner if needed    Blanca Friend, DO

## 2023-01-18 ENCOUNTER — Ambulatory Visit (HOSPITAL_COMMUNITY): Payer: BC Managed Care – PPO

## 2023-01-25 ENCOUNTER — Ambulatory Visit (HOSPITAL_COMMUNITY): Payer: BC Managed Care – PPO

## 2023-01-26 ENCOUNTER — Encounter (HOSPITAL_BASED_OUTPATIENT_CLINIC_OR_DEPARTMENT_OTHER): Payer: Self-pay | Admitting: Pediatric Allergy/Immunology

## 2023-02-02 ENCOUNTER — Encounter (HOSPITAL_BASED_OUTPATIENT_CLINIC_OR_DEPARTMENT_OTHER): Payer: Self-pay | Admitting: Pediatric Allergy/Immunology

## 2023-02-02 ENCOUNTER — Ambulatory Visit (HOSPITAL_COMMUNITY): Payer: BC Managed Care – PPO

## 2023-02-09 ENCOUNTER — Other Ambulatory Visit: Payer: Self-pay

## 2023-02-09 ENCOUNTER — Ambulatory Visit
Admission: RE | Admit: 2023-02-09 | Discharge: 2023-02-09 | Disposition: A | Discharge status: Home / Self Care | Payer: BC Managed Care – PPO | Source: Ambulatory Visit | Attending: Pediatric Allergy/Immunology | Admitting: Pediatric Allergy/Immunology

## 2023-02-09 VITALS — BP 116/77 | HR 89 | Temp 98.2°F | Resp 18 | Ht 60.0 in | Wt 163.8 lb

## 2023-02-09 DIAGNOSIS — D806 Antibody deficiency with near-normal immunoglobulins or with hyperimmunoglobulinemia: Secondary | ICD-10-CM | POA: Insufficient documentation

## 2023-02-09 DIAGNOSIS — D801 Nonfamilial hypogammaglobulinemia: Secondary | ICD-10-CM | POA: Insufficient documentation

## 2023-02-09 MED ORDER — METHYLPREDNISOLONE SOD SUCC 125 MG SOLUTION FOR INJECTION WRAPPER
INTRAVENOUS | Status: AC
Start: 2023-02-09 — End: 2023-02-09
  Filled 2023-02-09: qty 2

## 2023-02-09 MED ORDER — ACETAMINOPHEN 325 MG TABLET
ORAL_TABLET | ORAL | Status: AC
Start: 2023-02-09 — End: 2023-02-09
  Filled 2023-02-09: qty 2

## 2023-02-09 MED ORDER — DIPHENHYDRAMINE 50 MG CAPSULE
50.0000 mg | ORAL_CAPSULE | Freq: Once | ORAL | Status: AC
Start: 2023-02-09 — End: 2023-02-09
  Administered 2023-02-09: 50 mg via ORAL

## 2023-02-09 MED ORDER — DIPHENHYDRAMINE 50 MG CAPSULE
50.0000 mg | ORAL_CAPSULE | Freq: Once | ORAL | Status: DC | PRN
Start: 2023-02-09 — End: 2023-02-10

## 2023-02-09 MED ORDER — ACETAMINOPHEN 325 MG TABLET
650.0000 mg | ORAL_TABLET | Freq: Once | ORAL | Status: AC
Start: 2023-02-09 — End: 2023-02-09
  Administered 2023-02-09: 650 mg via ORAL

## 2023-02-09 MED ORDER — EPINEPHRINE HCL (PF) 1 MG/ML (1 ML) INJECTION SOLUTION
0.5000 mg | Freq: Once | INTRAMUSCULAR | Status: DC | PRN
Start: 2023-02-09 — End: 2023-02-10

## 2023-02-09 MED ORDER — DEXTROSE 5 % IN WATER (D5W) INTRAVENOUS SOLUTION
INTRAVENOUS | Status: DC
Start: 2023-02-09 — End: 2023-02-10
  Administered 2023-02-09: 0 g via INTRAVENOUS

## 2023-02-09 MED ORDER — METHYLPREDNISOLONE SOD SUCC 125 MG SOLUTION FOR INJECTION WRAPPER
62.5000 mg | Freq: Once | INTRAVENOUS | Status: AC
Start: 2023-02-09 — End: 2023-02-09
  Administered 2023-02-09: 62.5 mg via INTRAVENOUS

## 2023-02-09 MED ORDER — SODIUM CHLORIDE 0.9 % IV BOLUS
250.0000 mL | INJECTION | Freq: Once | Status: AC
Start: 2023-02-09 — End: 2023-02-09
  Administered 2023-02-09: 250 mL via INTRAVENOUS
  Administered 2023-02-09: 0 mL via INTRAVENOUS

## 2023-02-09 MED ORDER — DIPHENHYDRAMINE 50 MG CAPSULE
ORAL_CAPSULE | ORAL | Status: AC
Start: 2023-02-09 — End: 2023-02-09
  Filled 2023-02-09: qty 1

## 2023-02-09 MED ORDER — ACETAMINOPHEN 325 MG TABLET
650.0000 mg | ORAL_TABLET | Freq: Once | ORAL | Status: DC | PRN
Start: 2023-02-09 — End: 2023-02-10

## 2023-02-09 MED ORDER — HYDROCORTISONE SOD SUCCINATE 100 MG/2 ML VIAL WRAPPER
100.0000 mg | Freq: Once | INTRAMUSCULAR | Status: DC | PRN
Start: 2023-02-09 — End: 2023-02-10

## 2023-02-09 MED ORDER — ONDANSETRON HCL (PF) 4 MG/2 ML INJECTION SOLUTION
4.0000 mg | Freq: Once | INTRAMUSCULAR | Status: DC | PRN
Start: 2023-02-09 — End: 2023-02-10

## 2023-02-09 MED ORDER — IMMUNE GLOB,GAMM(IGG)10 %-MALT-IGA OVER 50 MCG/ML INTRAVENOUS SOLUTION
400.0000 mg/kg | Freq: Once | INTRAVENOUS | Status: AC
Start: 2023-02-09 — End: 2023-02-09
  Administered 2023-02-09: 0 mg via INTRAVENOUS
  Administered 2023-02-09: 30000 mg via INTRAVENOUS
  Filled 2023-02-09: qty 300

## 2023-02-09 MED ORDER — DIPHENHYDRAMINE 50 MG/ML INJECTION SOLUTION
50.0000 mg | Freq: Once | INTRAMUSCULAR | Status: DC | PRN
Start: 2023-02-09 — End: 2023-02-10

## 2023-02-09 NOTE — Nurses Notes (Addendum)
0800 - Patient ambulatory to room for IVIG infusion. Assessment complete. Lungs clear throughout lung fields. Abdomen soft and non-tender, bowel sounds present. No edema noted. Carolynn Serve, RN  [phone] - Orders released to pharmacy. Carolynn Serve, RN  [phone] - Peripheral IV started to left wrist. Patient tolerated well. Carolynn Serve, RN  [phone] - Premedications Tylenol, Benadryl, and Solumedrol given. Carolynn Serve, RN  [phone] - NS bolus started. Carolynn Serve, RN  [phone] - D5W continuous infusion started at 69ml/hr. Carolynn Serve, RN  [phone] - NS bolus complete. Carolynn Serve, RN  [phone] - IVIG infusion started. Titration per protocol. Carolynn Serve, RN  1105 - IVIG infusion complete. Line flushing. Patient tolerated well. Carolynn Serve, RN  1120 - D5W infusion stopped. Peripheral IV removed. Gauze dressing and Coban applied. Carolynn Serve, RN  [phone] - Patient left unit via ambulation. Carolynn Serve, RN

## 2023-02-13 ENCOUNTER — Ambulatory Visit (HOSPITAL_BASED_OUTPATIENT_CLINIC_OR_DEPARTMENT_OTHER): Payer: BC Managed Care – PPO | Admitting: Pediatric Allergy/Immunology

## 2023-02-13 ENCOUNTER — Other Ambulatory Visit: Payer: BC Managed Care – PPO

## 2023-02-13 ENCOUNTER — Other Ambulatory Visit: Payer: Self-pay

## 2023-02-13 DIAGNOSIS — R739 Hyperglycemia, unspecified: Secondary | ICD-10-CM | POA: Insufficient documentation

## 2023-02-13 DIAGNOSIS — D801 Nonfamilial hypogammaglobulinemia: Secondary | ICD-10-CM | POA: Insufficient documentation

## 2023-02-13 DIAGNOSIS — E785 Hyperlipidemia, unspecified: Secondary | ICD-10-CM | POA: Insufficient documentation

## 2023-02-13 DIAGNOSIS — R5383 Other fatigue: Secondary | ICD-10-CM | POA: Insufficient documentation

## 2023-02-13 DIAGNOSIS — I10 Essential (primary) hypertension: Secondary | ICD-10-CM | POA: Insufficient documentation

## 2023-02-13 LAB — CBC WITH DIFF
BASOPHIL #: 0 10*3/uL (ref 0.00–0.10)
BASOPHIL %: 1 % (ref 0–1)
EOSINOPHIL #: 0.1 10*3/uL (ref 0.00–0.50)
EOSINOPHIL %: 2 %
HCT: 39 % (ref 31.2–41.9)
HGB: 13.6 g/dL (ref 10.9–14.3)
LYMPHOCYTE #: 2.6 10*3/uL (ref 1.00–3.00)
LYMPHOCYTE %: 41 % (ref 16–44)
MCH: 31.5 pg (ref 24.7–32.8)
MCHC: 34.9 g/dL (ref 32.3–35.6)
MCV: 90.1 fL (ref 75.5–95.3)
MONOCYTE #: 0.4 10*3/uL (ref 0.30–1.00)
MONOCYTE %: 7 % (ref 5–13)
MPV: 6.9 fL — ABNORMAL LOW (ref 7.9–10.8)
NEUTROPHIL #: 3.2 10*3/uL (ref 1.85–7.80)
NEUTROPHIL %: 50 % (ref 43–77)
PLATELETS: 320 10*3/uL (ref 140–440)
RBC: 4.33 10*6/uL (ref 3.63–4.92)
RDW: 12.8 % (ref 12.3–17.7)
WBC: 6.4 10*3/uL (ref 3.8–11.8)

## 2023-02-13 LAB — LIPID PANEL
CHOL/HDL RATIO: 3.9
CHOLESTEROL: 176 mg/dL (ref <200)
HDL CHOL: 45 mg/dL (ref >=40)
LDL CALC: 71 mg/dL (ref 0–100)
TRIGLYCERIDES: 302 mg/dL — ABNORMAL HIGH (ref <=150)
VLDL CALC: 60 mg/dL — ABNORMAL HIGH (ref 0–50)

## 2023-02-13 LAB — COMPREHENSIVE METABOLIC PANEL, NON-FASTING
ALBUMIN/GLOBULIN RATIO: 1.6 — ABNORMAL HIGH (ref 0.8–1.4)
ALBUMIN: 4.6 g/dL (ref 3.5–5.7)
ALKALINE PHOSPHATASE: 94 U/L (ref 34–104)
ALT (SGPT): 31 U/L (ref 7–52)
ANION GAP: 8 mmol/L (ref 4–13)
AST (SGOT): 19 U/L (ref 13–39)
BILIRUBIN TOTAL: 0.4 mg/dL (ref 0.3–1.2)
BUN/CREA RATIO: 15 (ref 6–22)
BUN: 14 mg/dL (ref 7–25)
CALCIUM, CORRECTED: 8.9 mg/dL (ref 8.9–10.8)
CALCIUM: 9.4 mg/dL (ref 8.6–10.3)
CHLORIDE: 100 mmol/L (ref 98–107)
CO2 TOTAL: 29 mmol/L (ref 21–31)
CREATININE: 0.95 mg/dL (ref 0.60–1.30)
ESTIMATED GFR: 72 mL/min/{1.73_m2} (ref >59)
GLOBULIN: 2.9 (ref 2.9–5.4)
GLUCOSE: 118 mg/dL — ABNORMAL HIGH (ref 74–109)
OSMOLALITY, CALCULATED: 275 mOsm/kg (ref 270–290)
POTASSIUM: 4.2 mmol/L (ref 3.5–5.1)
PROTEIN TOTAL: 7.5 g/dL (ref 6.4–8.9)
SODIUM: 137 mmol/L (ref 136–145)

## 2023-02-13 LAB — THYROXINE, FREE (FREE T4): THYROXINE (T4), FREE: 0.65 ng/dL (ref 0.58–1.64)

## 2023-02-13 LAB — THYROID STIMULATING HORMONE (SENSITIVE TSH): TSH: 1.958 u[IU]/mL (ref 0.450–5.330)

## 2023-02-13 LAB — VITAMIN D 25 TOTAL: VITAMIN D: 89 ng/mL (ref 30–100)

## 2023-02-13 LAB — HGA1C (HEMOGLOBIN A1C WITH EST AVG GLUCOSE): HEMOGLOBIN A1C: 6.5 % — ABNORMAL HIGH (ref 4.0–6.0)

## 2023-03-02 ENCOUNTER — Ambulatory Visit (HOSPITAL_COMMUNITY): Payer: BC Managed Care – PPO

## 2023-03-05 ENCOUNTER — Ambulatory Visit (HOSPITAL_BASED_OUTPATIENT_CLINIC_OR_DEPARTMENT_OTHER): Payer: Self-pay | Admitting: Pediatric Allergy/Immunology

## 2023-03-05 NOTE — Telephone Encounter (Addendum)
Returned patient's call regarding the information below. Instructed patient to send message via MyChart or return call to [phone].    Katha Cabal, LPN        Regarding: infusion sied effects  ----- Message from Virl Diamond, [place] sent at 03/05/2023  4:04 PM EDT -----  [place]    Patient is calling asking to speak with a nurse about side effects from infusions

## 2023-03-09 ENCOUNTER — Ambulatory Visit
Admission: RE | Admit: 2023-03-09 | Discharge: 2023-03-09 | Disposition: A | Discharge status: Home / Self Care | Payer: BC Managed Care – PPO | Source: Ambulatory Visit | Attending: Pediatric Allergy/Immunology | Admitting: Pediatric Allergy/Immunology

## 2023-03-09 ENCOUNTER — Other Ambulatory Visit: Payer: Self-pay

## 2023-03-09 VITALS — BP 140/84 | HR 89 | Temp 97.3°F | Resp 18

## 2023-03-09 DIAGNOSIS — D806 Antibody deficiency with near-normal immunoglobulins or with hyperimmunoglobulinemia: Secondary | ICD-10-CM | POA: Insufficient documentation

## 2023-03-09 DIAGNOSIS — D801 Nonfamilial hypogammaglobulinemia: Secondary | ICD-10-CM | POA: Insufficient documentation

## 2023-03-09 MED ORDER — DIPHENHYDRAMINE 50 MG/ML INJECTION SOLUTION
50.0000 mg | Freq: Once | INTRAMUSCULAR | Status: DC | PRN
Start: 2023-03-09 — End: 2023-03-10

## 2023-03-09 MED ORDER — ACETAMINOPHEN 325 MG TABLET
650.0000 mg | ORAL_TABLET | Freq: Once | ORAL | Status: AC
Start: 2023-03-09 — End: 2023-03-09
  Administered 2023-03-09: 650 mg via ORAL

## 2023-03-09 MED ORDER — DIPHENHYDRAMINE 50 MG CAPSULE
ORAL_CAPSULE | ORAL | Status: AC
Start: 2023-03-09 — End: 2023-03-09
  Filled 2023-03-09: qty 1

## 2023-03-09 MED ORDER — ONDANSETRON HCL (PF) 4 MG/2 ML INJECTION SOLUTION
4.0000 mg | Freq: Once | INTRAMUSCULAR | Status: DC | PRN
Start: 2023-03-09 — End: 2023-03-10

## 2023-03-09 MED ORDER — METHYLPREDNISOLONE SOD SUCC 125 MG SOLUTION FOR INJECTION WRAPPER
INTRAVENOUS | Status: AC
Start: 2023-03-09 — End: 2023-03-09
  Filled 2023-03-09: qty 2

## 2023-03-09 MED ORDER — EPINEPHRINE HCL (PF) 1 MG/ML (1 ML) INJECTION SOLUTION
0.5000 mg | Freq: Once | INTRAMUSCULAR | Status: DC | PRN
Start: 2023-03-09 — End: 2023-03-10

## 2023-03-09 MED ORDER — METHYLPREDNISOLONE SOD SUCC 125 MG SOLUTION FOR INJECTION WRAPPER
62.5000 mg | Freq: Once | INTRAVENOUS | Status: AC
Start: 2023-03-09 — End: 2023-03-09
  Administered 2023-03-09: 62.5 mg via INTRAVENOUS

## 2023-03-09 MED ORDER — SODIUM CHLORIDE 0.9 % IV BOLUS
250.0000 mL | INJECTION | Freq: Once | Status: AC
Start: 2023-03-09 — End: 2023-03-09
  Administered 2023-03-09: 0 mL via INTRAVENOUS
  Administered 2023-03-09: 250 mL via INTRAVENOUS

## 2023-03-09 MED ORDER — DIPHENHYDRAMINE 50 MG CAPSULE
50.0000 mg | ORAL_CAPSULE | Freq: Once | ORAL | Status: AC
Start: 2023-03-09 — End: 2023-03-09
  Administered 2023-03-09: 50 mg via ORAL

## 2023-03-09 MED ORDER — IMMUNE GLOB,GAMM(IGG)10 %-MALT-IGA OVER 50 MCG/ML INTRAVENOUS SOLUTION
400.0000 mg/kg | Freq: Once | INTRAVENOUS | Status: AC
Start: 2023-03-09 — End: 2023-03-09
  Administered 2023-03-09: 0 mg via INTRAVENOUS
  Administered 2023-03-09: 30000 mg via INTRAVENOUS
  Filled 2023-03-09: qty 300

## 2023-03-09 MED ORDER — ACETAMINOPHEN 325 MG TABLET
650.0000 mg | ORAL_TABLET | Freq: Once | ORAL | Status: DC | PRN
Start: 2023-03-09 — End: 2023-03-10

## 2023-03-09 MED ORDER — ACETAMINOPHEN 325 MG TABLET
ORAL_TABLET | ORAL | Status: AC
Start: 2023-03-09 — End: 2023-03-09
  Filled 2023-03-09: qty 2

## 2023-03-09 MED ORDER — DEXTROSE 5 % IN WATER (D5W) INTRAVENOUS SOLUTION
INTRAVENOUS | Status: DC
Start: 2023-03-09 — End: 2023-03-10

## 2023-03-09 MED ORDER — DIPHENHYDRAMINE 50 MG CAPSULE
50.0000 mg | ORAL_CAPSULE | Freq: Once | ORAL | Status: DC | PRN
Start: 2023-03-09 — End: 2023-03-10

## 2023-03-09 MED ORDER — HYDROCORTISONE SOD SUCCINATE 100 MG/2 ML VIAL WRAPPER
100.0000 mg | Freq: Once | INTRAMUSCULAR | Status: DC | PRN
Start: 2023-03-09 — End: 2023-03-10

## 2023-03-09 NOTE — Nurses Notes (Signed)
0805: Patient arrived VSS. Assessments WNL. No complaints voiced, but reports feeling tired like having the flu.Evans Lance, RN  [phone]: IV access in right forearm. Pre meds given per MARS. Evans Lance, RN  [phone]: 30g Octagam started and titrated with rate adjustments every 30 minutes per pharmacy. Evans Lance, RN  [phone]: Infusion and flush complete. VSS. IV removed with catheter intact.Evans Lance, RN  [phone]: Patient left unit ambulatory in no reported distress.Evans Lance, RN

## 2023-03-23 ENCOUNTER — Encounter (HOSPITAL_BASED_OUTPATIENT_CLINIC_OR_DEPARTMENT_OTHER): Payer: Self-pay | Admitting: Pediatric Allergy/Immunology

## 2023-03-30 ENCOUNTER — Ambulatory Visit (HOSPITAL_COMMUNITY): Payer: BC Managed Care – PPO

## 2023-04-06 ENCOUNTER — Ambulatory Visit (HOSPITAL_COMMUNITY): Payer: BC Managed Care – PPO

## 2023-06-05 ENCOUNTER — Encounter (HOSPITAL_BASED_OUTPATIENT_CLINIC_OR_DEPARTMENT_OTHER): Payer: Self-pay | Admitting: Pediatric Allergy/Immunology

## 2023-06-06 ENCOUNTER — Encounter (HOSPITAL_BASED_OUTPATIENT_CLINIC_OR_DEPARTMENT_OTHER): Payer: Self-pay | Admitting: Pediatric Allergy/Immunology

## 2023-06-07 ENCOUNTER — Encounter (HOSPITAL_BASED_OUTPATIENT_CLINIC_OR_DEPARTMENT_OTHER): Payer: Self-pay | Admitting: Pediatric Allergy/Immunology

## 2023-06-12 ENCOUNTER — Encounter (HOSPITAL_BASED_OUTPATIENT_CLINIC_OR_DEPARTMENT_OTHER): Payer: Self-pay | Admitting: Pediatric Allergy/Immunology

## 2023-06-15 ENCOUNTER — Ambulatory Visit (HOSPITAL_BASED_OUTPATIENT_CLINIC_OR_DEPARTMENT_OTHER): Payer: BC Managed Care – PPO | Admitting: Pediatric Allergy/Immunology

## 2023-06-15 ENCOUNTER — Encounter (HOSPITAL_BASED_OUTPATIENT_CLINIC_OR_DEPARTMENT_OTHER): Payer: Self-pay | Admitting: Pediatric Allergy/Immunology

## 2023-06-18 ENCOUNTER — Encounter (HOSPITAL_BASED_OUTPATIENT_CLINIC_OR_DEPARTMENT_OTHER): Payer: Self-pay | Admitting: Pediatric Allergy/Immunology

## 2023-06-19 ENCOUNTER — Encounter (HOSPITAL_BASED_OUTPATIENT_CLINIC_OR_DEPARTMENT_OTHER): Payer: Self-pay | Admitting: Pediatric Allergy/Immunology

## 2023-06-27 ENCOUNTER — Encounter (HOSPITAL_BASED_OUTPATIENT_CLINIC_OR_DEPARTMENT_OTHER): Payer: Self-pay | Admitting: Pediatric Allergy/Immunology

## 2023-06-27 DIAGNOSIS — D806 Antibody deficiency with near-normal immunoglobulins or with hyperimmunoglobulinemia: Secondary | ICD-10-CM

## 2023-06-27 NOTE — Telephone Encounter (Signed)
Scheduling order placed for IVIG infusions at [facility].   Estell Harpin, RN

## 2023-07-19 ENCOUNTER — Encounter (HOSPITAL_BASED_OUTPATIENT_CLINIC_OR_DEPARTMENT_OTHER): Payer: Self-pay | Admitting: Pediatric Allergy/Immunology

## 2023-07-25 ENCOUNTER — Encounter (HOSPITAL_BASED_OUTPATIENT_CLINIC_OR_DEPARTMENT_OTHER): Payer: Self-pay | Admitting: Pediatric Allergy/Immunology

## 2023-07-30 ENCOUNTER — Ambulatory Visit (HOSPITAL_COMMUNITY): Payer: Managed Care, Other (non HMO)

## 2023-08-05 ENCOUNTER — Encounter (HOSPITAL_BASED_OUTPATIENT_CLINIC_OR_DEPARTMENT_OTHER): Payer: Self-pay | Admitting: Pediatric Allergy/Immunology

## 2023-08-15 ENCOUNTER — Encounter (HOSPITAL_BASED_OUTPATIENT_CLINIC_OR_DEPARTMENT_OTHER): Payer: Self-pay | Admitting: Pediatric Allergy/Immunology

## 2023-08-27 ENCOUNTER — Ambulatory Visit
Admission: RE | Admit: 2023-08-27 | Discharge: 2023-08-27 | Disposition: A | Discharge status: Home / Self Care | Payer: Managed Care, Other (non HMO) | Source: Ambulatory Visit | Attending: Pediatric Allergy/Immunology

## 2023-08-27 ENCOUNTER — Encounter (HOSPITAL_COMMUNITY): Payer: Self-pay

## 2023-08-27 ENCOUNTER — Other Ambulatory Visit (HOSPITAL_BASED_OUTPATIENT_CLINIC_OR_DEPARTMENT_OTHER): Payer: Self-pay | Admitting: Pediatric Allergy/Immunology

## 2023-08-27 ENCOUNTER — Other Ambulatory Visit: Payer: Self-pay

## 2023-08-27 VITALS — BP 144/90 | HR 94 | Temp 97.9°F | Resp 18 | Ht 60.0 in | Wt 165.0 lb

## 2023-08-27 DIAGNOSIS — D801 Nonfamilial hypogammaglobulinemia: Secondary | ICD-10-CM | POA: Insufficient documentation

## 2023-08-27 DIAGNOSIS — D806 Antibody deficiency with near-normal immunoglobulins or with hyperimmunoglobulinemia: Secondary | ICD-10-CM | POA: Insufficient documentation

## 2023-08-27 MED ORDER — DIPHENHYDRAMINE 50 MG CAPSULE
50.0000 mg | ORAL_CAPSULE | Freq: Once | ORAL | Status: DC | PRN
Start: 2023-08-27 — End: 2023-08-28

## 2023-08-27 MED ORDER — IMMUNE GLOB,GAMM(IGG)10 %-MALT-IGA OVER 50 MCG/ML INTRAVENOUS SOLUTION
400.0000 mg/kg | Freq: Once | INTRAVENOUS | Status: AC
Start: 2023-08-27 — End: 2023-08-27
  Administered 2023-08-27: 0 mg via INTRAVENOUS
  Administered 2023-08-27: 30000 mg via INTRAVENOUS
  Filled 2023-08-27: qty 300

## 2023-08-27 MED ORDER — ONDANSETRON HCL (PF) 4 MG/2 ML INJECTION SOLUTION
INTRAMUSCULAR | Status: AC
Start: 2023-08-27 — End: 2023-08-27
  Filled 2023-08-27: qty 2

## 2023-08-27 MED ORDER — METHYLPREDNISOLONE SOD SUCC 125 MG SOLUTION FOR INJECTION WRAPPER
62.5000 mg | Freq: Once | INTRAVENOUS | Status: AC
Start: 2023-08-27 — End: 2023-08-27
  Administered 2023-08-27: 62.5 mg via INTRAVENOUS

## 2023-08-27 MED ORDER — ONDANSETRON HCL (PF) 4 MG/2 ML INJECTION SOLUTION
4.0000 mg | Freq: Once | INTRAMUSCULAR | Status: AC | PRN
Start: 2023-08-27 — End: 2023-08-27
  Administered 2023-08-27: 4 mg via INTRAVENOUS

## 2023-08-27 MED ORDER — EPINEPHRINE 1 MG/ML (1 ML) INJECTION SOLUTION
0.5000 mg | Freq: Once | INTRAMUSCULAR | Status: DC | PRN
Start: 2023-08-27 — End: 2023-08-28

## 2023-08-27 MED ORDER — DIPHENHYDRAMINE 50 MG CAPSULE
ORAL_CAPSULE | ORAL | Status: AC
Start: 2023-08-27 — End: 2023-08-27
  Filled 2023-08-27: qty 1

## 2023-08-27 MED ORDER — METHYLPREDNISOLONE SOD SUCC 125 MG SOLUTION FOR INJECTION WRAPPER
INTRAVENOUS | Status: AC
Start: 2023-08-27 — End: 2023-08-27
  Filled 2023-08-27: qty 2

## 2023-08-27 MED ORDER — DEXTROSE 5 % IN WATER (D5W) INTRAVENOUS SOLUTION
INTRAVENOUS | Status: DC
Start: 2023-08-27 — End: 2023-08-28
  Administered 2023-08-27: 0 g via INTRAVENOUS

## 2023-08-27 MED ORDER — ACETAMINOPHEN 325 MG TABLET
ORAL_TABLET | ORAL | Status: AC
Start: 2023-08-27 — End: 2023-08-27
  Filled 2023-08-27: qty 2

## 2023-08-27 MED ORDER — DIPHENHYDRAMINE 50 MG/ML INJECTION SOLUTION
50.0000 mg | Freq: Once | INTRAMUSCULAR | Status: DC | PRN
Start: 2023-08-27 — End: 2023-08-28

## 2023-08-27 MED ORDER — HYDROCORTISONE SOD SUCCINATE 100 MG/2 ML VIAL WRAPPER
100.0000 mg | Freq: Once | INTRAMUSCULAR | Status: DC | PRN
Start: 2023-08-27 — End: 2023-08-28

## 2023-08-27 MED ORDER — ACETAMINOPHEN 325 MG TABLET
650.0000 mg | ORAL_TABLET | Freq: Once | ORAL | Status: AC
Start: 2023-08-27 — End: 2023-08-27
  Administered 2023-08-27: 650 mg via ORAL

## 2023-08-27 MED ORDER — SODIUM CHLORIDE 0.9 % IV BOLUS
250.0000 mL | INJECTION | Freq: Once | Status: AC
Start: 2023-08-27 — End: 2023-08-27
  Administered 2023-08-27: 250 mL via INTRAVENOUS
  Administered 2023-08-27: 0 mL via INTRAVENOUS

## 2023-08-27 MED ORDER — ACETAMINOPHEN 325 MG TABLET
650.0000 mg | ORAL_TABLET | Freq: Once | ORAL | Status: DC | PRN
Start: 2023-08-27 — End: 2023-08-28

## 2023-08-27 MED ORDER — DIPHENHYDRAMINE 50 MG CAPSULE
50.0000 mg | ORAL_CAPSULE | Freq: Once | ORAL | Status: AC
Start: 2023-08-27 — End: 2023-08-27
  Administered 2023-08-27: 50 mg via ORAL

## 2023-08-27 NOTE — Nurses Notes (Addendum)
0815-Patient to room ambulatory.  Patient has no complaints this morning.  Previous IVIG treatment patient experienced nausea with treatment.  Zofran was given today prior to treatment at patient's request.  Lung sounds are clear and strong palpable pulses present.  Vitals are stable.Salli Quarry, RN  0830-Peripheral IV placed and blood return obtained.Salli Quarry, RN  0830-Normal saline bolus started.Salli Quarry, RN  0837-Tylenol, Benadryl, and Solumedrol.Salli Quarry, RN  0845-Normal saline bolus completed.Salli Quarry, RN  0853-Zofran given.Salli Quarry, RN  0915-OCTAGAM and D5W infusions started and running concurrently.Salli Quarry, RN  1155-OCTAGAM and D5W infusions completed.  Patient tolerated well.Salli Quarry, RN  1205-Peripheral IV removed with catheter intact.Salli Quarry, RN  1209-Patient checking out at this time.  Vitals are stable.Salli Quarry, RN

## 2023-09-05 ENCOUNTER — Encounter (HOSPITAL_COMMUNITY): Payer: Self-pay

## 2023-09-06 ENCOUNTER — Encounter (HOSPITAL_BASED_OUTPATIENT_CLINIC_OR_DEPARTMENT_OTHER): Payer: Self-pay | Admitting: Pediatric Allergy/Immunology

## 2023-09-11 ENCOUNTER — Ambulatory Visit (HOSPITAL_BASED_OUTPATIENT_CLINIC_OR_DEPARTMENT_OTHER): Payer: Self-pay | Admitting: Pediatric Allergy/Immunology

## 2023-09-11 NOTE — Telephone Encounter (Addendum)
Spoke to patient to inform her that she needed to have an in person visit due to Dr. Alcus Dad not seeing her in clinic since 01/2022, she verbalized understanding. Appointment rescheduled per patient's request to 10/26/23 at 2:40pm. She verbalized understanding of this.    Debroah Baller, RN      Regarding: appt request to be changed to [place] on 11.21.24  ----- Message from Garner Gavel sent at 09/11/2023  8:22 AM EST -----  Copied From CRM [phone].[place]ppers pt  [place], Eulanda KATHERINE called to see if appt on 11.21.24 can be changed to a MyChart appt.  Please msg through MyChart if this can be done.  Thanks

## 2023-09-12 ENCOUNTER — Encounter (HOSPITAL_BASED_OUTPATIENT_CLINIC_OR_DEPARTMENT_OTHER): Payer: Self-pay | Admitting: Pediatric Allergy/Immunology

## 2023-09-13 ENCOUNTER — Encounter (HOSPITAL_BASED_OUTPATIENT_CLINIC_OR_DEPARTMENT_OTHER): Payer: Self-pay | Admitting: Pediatric Allergy/Immunology

## 2023-09-13 ENCOUNTER — Ambulatory Visit: Payer: Managed Care, Other (non HMO) | Admitting: Pediatric Allergy/Immunology

## 2023-09-14 ENCOUNTER — Encounter (HOSPITAL_BASED_OUTPATIENT_CLINIC_OR_DEPARTMENT_OTHER): Payer: Self-pay | Admitting: Pediatric Allergy/Immunology

## 2023-09-14 ENCOUNTER — Ambulatory Visit: Payer: Managed Care, Other (non HMO) | Attending: Pediatric Allergy/Immunology | Admitting: Pediatric Allergy/Immunology

## 2023-09-14 DIAGNOSIS — D830 Common variable immunodeficiency with predominant abnormalities of B-cell numbers and function: Secondary | ICD-10-CM

## 2023-09-14 DIAGNOSIS — D8989 Other specified disorders involving the immune mechanism, not elsewhere classified: Secondary | ICD-10-CM

## 2023-09-14 MED ORDER — DOXYCYCLINE HYCLATE 100 MG CAPSULE
100.0000 mg | ORAL_CAPSULE | Freq: Two times a day (BID) | ORAL | 0 refills | Status: AC
Start: 2023-09-14 — End: 2023-09-28

## 2023-09-14 MED ORDER — FLUCONAZOLE 150 MG TABLET
150.0000 mg | ORAL_TABLET | Freq: Every day | ORAL | 0 refills | Status: AC
Start: 2023-09-14 — End: 2023-09-16

## 2023-09-14 NOTE — Progress Notes (Unsigned)
[facility]  [address]  [place] [address]  Operated by [facility]  Video Visit     Name: Margaret Strickland  MRN: [id number]    Date: 09/14/2023  Age: 52 y.o.                            Patient's location: Home - [place] [address]   Patient/family aware of provider location: Yes  Patient/family consent for video visit: Yes  Interview and observation performed by: Blanca Friend, DO    Chief Complaint: Immunodefiency    History of Present Illness:  Margaret Strickland is a 51 y.o. female     Patient notes that she missed a few rounds of IVIG 2nd to insurance and she notes that she was getting sick more often off IVIG.  She had COVID-19, increased fatigue. and notes increase sinus pain or pressure.  She notes.    Patient would like to get home infusions 2nd to work. She lost time off work, 10 days, for  COVID-19.           Current Outpatient Medications   Medication Sig    amitriptyline (ELAVIL) 25 mg Oral Tablet Take 2 Tablets (50 mg total) by mouth Every night    aspirin 81 mg Oral Tablet, Chewable Chew 1 Tablet (81 mg total) Once a day    atorvastatin (LIPITOR) 40 mg Oral Tablet Take 1 Tablet (40 mg total) by mouth Once a day    calcium carb/mag carb/folic ac (MAGNESIUM-CALCIUM-FOLIC ACID ORAL) magnesium Take No date recorded No form recorded No frequency recorded No route recorded No set duration recorded No set duration amount recorded active No dosage strength recorded No dosage strength units of measure recorded (Patient not taking: Reported on 02/06/2022)    CALCIUM-MAGNESIUM-ZINC ORAL Take by mouth Twice daily    cetirizine (ZYRTEC) 10 mg Oral Tablet Take 1 Tablet (10 mg total) by mouth Once a day    clonazePAM (KLONOPIN) 1 mg Oral Tablet Take 1 Tablet (1 mg total) by mouth Twice daily    desvenlafaxine (PRISTIQ) 100 mg Oral Tablet Sustained Release 24 hr Take 1 Tablet (100 mg total) by mouth Once a day    desvenlafaxine succinate (PRISTIQ) 50 mg Oral Tablet  Sustained Release 24 hr Take 1 Tablet (50 mg total) by mouth Once a day    doxycycline hyclate (VIBRAMYCIN) 100 mg Oral Capsule Take 1 Capsule (100 mg total) by mouth Twice daily for 14 days    ergocalciferol, vitamin D2, (VITAMIN D2 ORAL) Take 2,000 Units by mouth Once a day    fluconazole (DIFLUCAN) 150 mg Oral Tablet Take 1 Tablet (150 mg total) by mouth Once a day for 2 days    KRILL OIL ORAL Take 1 Each by mouth Every night    labetaloL (NORMODYNE) 300 mg Oral Tablet Take 1 Tablet (300 mg total) by mouth Twice daily    montelukast (SINGULAIR) 10 mg Oral Tablet Take 1 Tablet (10 mg total) by mouth Every night    omeprazole (PRILOSEC) 20 mg Oral Capsule, Delayed Release(E.C.) Take 1 Capsule (20 mg total) by mouth Once a day    ondansetron (ZOFRAN ODT) 4 mg Oral Tablet, Rapid Dissolve Take 1 Tablet (4 mg total) by mouth Every 8 hours as needed for Nausea/Vomiting for up to 3 days after IVIG     Allergies   Allergen Reactions    Carbamazepine  Vortioxetine  Other Adverse Reaction (Add comment)     Past Medical History:   Diagnosis Date    Allergic rhinitis     Anxiety     Depression     Drug allergy     Essential hypertension     Hyperlipemia     Migraine     Vitamin A deficiency          Past Surgical History:   Procedure Laterality Date    HX APPENDECTOMY      HX TONSILLECTOMY      HX TUBAL LIGATION           Family Medical History:       Problem Relation (Age of Onset)    Allergic rhinitis Mother, Sister    Asthma Sister    COPD Mother, Sister    Depression Father    Drug Allergy Sister    Food Allergy Mother    Heart Attack Mother, Maternal Grandmother    Hypertension (High Blood Pressure) Father, Sister, Brother            Social History     Socioeconomic History    Marital status: Married   Tobacco Use    Smoking status: Former     Current packs/day: 1.00     Average packs/day: 1 pack/day for 20.0 years (20.0 ttl pk-yrs)     Types: Cigarettes    Smokeless tobacco: Never         Review of  Systems:  Constitutional: Denies fevers, chills, or fatigue.   Eyes: Denies irritation, erythema, discharge or pain.   Ears: Denies any ear pain/tugging or discharge.  Nose: Denies nasal congestion, purulent rhinorrhea,  nose bleeds   Mouth/Throat: Denies any oral ulcers or other lesions.   Cardiovascular: Denies any cyanosis, murmurs, periorbital edema   Respiratory: Denies any shortness of breath, wheezing, cough   GI: Denies any abdominal pain, vomiting, diarrhea or constipation.   Skin: Denies: skin rashes or lesions    Observational Exam:   General: No acute distress  Eyes: EOMI, Conjunctiva are clear. No discharge or icterus noted.  HENT: Mucous membranes are moist. External Nares are without crusted rhinorrhea  Lungs: symmetrical Chest rise b/l  Cardiovascular:  No cyanosis.   Skin: no rashes or lesions on face, neck lower arms or hands  Neurologic: grossly normal.  Psychiatric: Affect within normal limits.    Assessment/Plan:  No diagnosis found.  Orders Placed This Encounter    doxycycline hyclate (VIBRAMYCIN) 100 mg Oral Capsule    fluconazole (DIFLUCAN) 150 mg Oral Tablet         Follow Up:  ***    Blanca Friend, DO

## 2023-09-17 ENCOUNTER — Ambulatory Visit (HOSPITAL_BASED_OUTPATIENT_CLINIC_OR_DEPARTMENT_OTHER): Payer: Self-pay | Admitting: Pediatric Allergy/Immunology

## 2023-09-17 ENCOUNTER — Encounter (HOSPITAL_BASED_OUTPATIENT_CLINIC_OR_DEPARTMENT_OTHER): Payer: Self-pay

## 2023-09-17 NOTE — Telephone Encounter (Signed)
Orders for home infusion and demographics faxed to [facility] per patient's request at fax # [phone]. Fax confirmation received.    Debroah Baller, RN

## 2023-09-18 ENCOUNTER — Encounter (HOSPITAL_BASED_OUTPATIENT_CLINIC_OR_DEPARTMENT_OTHER): Payer: Self-pay | Admitting: Pediatric Allergy/Immunology

## 2023-09-24 ENCOUNTER — Ambulatory Visit: Payer: Managed Care, Other (non HMO)

## 2023-09-28 ENCOUNTER — Ambulatory Visit (HOSPITAL_BASED_OUTPATIENT_CLINIC_OR_DEPARTMENT_OTHER): Payer: Self-pay | Admitting: Pediatric Allergy/Immunology

## 2023-09-28 NOTE — Telephone Encounter (Signed)
Spoke with France Ravens at [facility] in [place] to confirm that we do want them to draw the lab work that was ordered with the infusion orders. She verbalized understanding.    Debroah Baller, RN

## 2023-10-26 ENCOUNTER — Ambulatory Visit (HOSPITAL_BASED_OUTPATIENT_CLINIC_OR_DEPARTMENT_OTHER): Payer: Managed Care, Other (non HMO) | Admitting: Pediatric Allergy/Immunology

## 2023-10-31 ENCOUNTER — Encounter (HOSPITAL_BASED_OUTPATIENT_CLINIC_OR_DEPARTMENT_OTHER): Payer: Self-pay | Admitting: Pediatric Allergy/Immunology

## 2023-11-12 ENCOUNTER — Encounter (HOSPITAL_BASED_OUTPATIENT_CLINIC_OR_DEPARTMENT_OTHER): Payer: Self-pay | Admitting: Pediatric Allergy/Immunology

## 2023-11-25 ENCOUNTER — Encounter (HOSPITAL_BASED_OUTPATIENT_CLINIC_OR_DEPARTMENT_OTHER): Payer: Self-pay | Admitting: Pediatric Allergy/Immunology

## 2023-11-26 ENCOUNTER — Encounter (HOSPITAL_BASED_OUTPATIENT_CLINIC_OR_DEPARTMENT_OTHER): Payer: Self-pay | Admitting: Pediatric Allergy/Immunology

## 2023-11-27 ENCOUNTER — Ambulatory Visit (HOSPITAL_BASED_OUTPATIENT_CLINIC_OR_DEPARTMENT_OTHER): Payer: Managed Care, Other (non HMO) | Admitting: Pediatric Allergy/Immunology

## 2023-11-27 ENCOUNTER — Encounter (HOSPITAL_BASED_OUTPATIENT_CLINIC_OR_DEPARTMENT_OTHER): Payer: Self-pay

## 2023-11-27 ENCOUNTER — Other Ambulatory Visit (HOSPITAL_BASED_OUTPATIENT_CLINIC_OR_DEPARTMENT_OTHER): Payer: Self-pay | Admitting: Pediatric Allergy/Immunology

## 2023-11-28 ENCOUNTER — Telehealth (HOSPITAL_BASED_OUTPATIENT_CLINIC_OR_DEPARTMENT_OTHER): Payer: Self-pay | Admitting: Pediatric Allergy/Immunology

## 2023-11-28 NOTE — Telephone Encounter (Addendum)
SCIG orders faxed to [facility] at fax # [phone]. Fax confirmation received.    Debroah Baller, RN      ----- Message from Blanca Friend, DO sent at 11/27/2023  9:12 AM EST -----  Regarding: SCIG 10 grams weekly  Currently on IVIG 30 grams every 4 weeks.  Conversion would be 10 grams weekly SCIG for 40 grams every 4 weeks.

## 2023-11-29 ENCOUNTER — Ambulatory Visit (HOSPITAL_BASED_OUTPATIENT_CLINIC_OR_DEPARTMENT_OTHER): Payer: Self-pay | Admitting: Pediatric Allergy/Immunology

## 2023-11-29 NOTE — Telephone Encounter (Addendum)
Email sent to Ig Concierge Team at [facility] requesting clarification.    Debroah Baller, RN      Regarding: pharmacy calling  ----- Message from Garner Gavel sent at 11/29/2023 10:30 AM EST -----  Copied From CRM [id number].[place] pt  Margaret Strickland -[facility] called in reference to order for unbranded IG Therapy med.  They do have an unbranded drug, Cutaquig.  Please call to see if this med is ok.  Thanks

## 2023-11-30 ENCOUNTER — Encounter (HOSPITAL_BASED_OUTPATIENT_CLINIC_OR_DEPARTMENT_OTHER): Payer: Self-pay | Admitting: Pediatric Allergy/Immunology

## 2023-12-04 ENCOUNTER — Ambulatory Visit (HOSPITAL_BASED_OUTPATIENT_CLINIC_OR_DEPARTMENT_OTHER): Payer: Self-pay | Admitting: Pediatric Allergy/Immunology

## 2023-12-04 NOTE — Telephone Encounter (Signed)
Cutaquig prior authorization form completed, signed, and faxed to [facility] to fax # [phone]. Fax confirmation received.    Debroah Baller, RN

## 2023-12-14 ENCOUNTER — Encounter (HOSPITAL_BASED_OUTPATIENT_CLINIC_OR_DEPARTMENT_OTHER): Payer: Self-pay | Admitting: Pediatric Allergy/Immunology

## 2024-01-16 LAB — LAB: COLOGUARD RESULT: NEGATIVE

## 2024-01-16 LAB — COLOGUARD® COLON CANCER SCREEN: COLOGUARD RESULT: NEGATIVE

## 2024-02-24 ENCOUNTER — Other Ambulatory Visit: Payer: Self-pay

## 2024-02-25 ENCOUNTER — Other Ambulatory Visit: Payer: Self-pay

## 2024-02-25 ENCOUNTER — Ambulatory Visit (HOSPITAL_BASED_OUTPATIENT_CLINIC_OR_DEPARTMENT_OTHER): Admitting: Gynecology

## 2024-02-25 ENCOUNTER — Encounter (HOSPITAL_BASED_OUTPATIENT_CLINIC_OR_DEPARTMENT_OTHER): Payer: Self-pay | Admitting: Pediatric Allergy/Immunology

## 2024-02-25 ENCOUNTER — Ambulatory Visit: Payer: Self-pay | Attending: Pediatric Allergy/Immunology | Admitting: Pediatric Allergy/Immunology

## 2024-02-25 VITALS — BP 128/89 | HR 93 | Temp 97.6°F | Ht 60.0 in | Wt 164.5 lb

## 2024-02-25 DIAGNOSIS — D830 Common variable immunodeficiency with predominant abnormalities of B-cell numbers and function: Secondary | ICD-10-CM

## 2024-02-25 DIAGNOSIS — D8989 Other specified disorders involving the immune mechanism, not elsewhere classified: Secondary | ICD-10-CM | POA: Insufficient documentation

## 2024-02-25 NOTE — Progress Notes (Signed)
 [facility]ER[facility]OLOGY, [facility]  [address]  [place] [address]  Office Visit    Name: Margaret Strickland   DOB: September 22, 1971  Date of Service: 02/25/2024     Chief Complaint(s):   Chief Complaint   Patient presents with    Immunodefiency       Subjective:  Patient presents for follow-up on their immunodeficiency. Margaret Strickland was last seen in the clinic on 02/06/2022. At that time, Margaret Strickland was tolerating infusions well and was recommended to continue IVIG every 4 weeks using at home infusions, now on SCIG weekly. She did have an acute sinus infection and possible yeast infection which was treated using Doxycycline  and Diflucan  respectively. We also reviewed the results of genetic testing through [facility] using their primary immunodeficiency panel; this testing did not identify any genetic variants related to her immune deficiency.      Infections/Immune:  Patient notes that since their last visit they have had a sinus infection which was treated using doxy beginning last December. This medication was ineffective. She was then seen at an [facility] location where she was given Azithromycin . PCP then gave Amoxicillin course. Margaret Strickland Strickland that Azithromycin  and Amoxicillin were not effective. Margaret Strickland Strickland that her nasal mucous is still persistently green and hard to clear. She has not had any mucous cultures performed. She has had a CT scan which did not show any signs of infection. Margaret Strickland Strickland that her mucous drainage is constantly green in coloration and is noticed every day. Margaret Strickland does have a deviated nasal septum and has never had any surgical correction for this condition. Margaret Strickland Strickland a feeling of congestion and soreness in the nose. She continues to follow with adult ENT outside of our facility. Margaret Strickland Strickland that her ENT physician had noted that her nasal condition had been improving; they have also cauterized one nostril twice in the interim.    Margaret Strickland Strickland that she has been receiving SCIG injections at home. She  gives herself these injections which she spreads out over 4 sites. She explains that one sight near her right lower flank/abdomen causes a pinching sensation which is described as "like a bee sting" and causes itching. None of her other injection sites cause any negative side effects. Margaret Strickland that when she does not receive her infusions regularly she will begin to develop aching body pain. She has not had any flare-ups sicne starting SCIG. Patient denies: increased bruising or bleeding, increase or changes in bone pain or joint pain, night time sweating, wt loss.     ROS:  Constitutional: Denies fevers, chills, night sweats. No recent weight changes or fatigue.   Eyes: Denies  irritation, erythema, discharge or pain.   Ears: Denies ear pain, or discharge.  Nose/Sinus: Denies: sinus pain, congestion or purulent rhinorrhea  Mouth/Throat: Denies any sore throat or oral lesions  Cardiovascular: Denies any chest pain, or DOE  Respiratory: Denies any shortness of breath, wheezing, cough   GI: Denies any abdominal pain, nausea, vomiting, diarrhea or constipation.   Skin: Denies any recent rashes or lesions.   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    Anxiety 07/30/2019    Depression 07/30/2019    Chronic low back pain 05/14/2019    Fatigue 12/26/2018  Hyperlipidemia 12/26/2018    Lower respiratory tract infection 06/15/2018    Lyme disease 05/22/2016    Cough 12/11/2015    Allergic rhinitis 08/17/2015    Generalized anxiety disorder 12/24/2014    Vitamin D  deficiency 11/17/2014    Pain in limb 02/22/2007    Encounter for long-term (current) use of insulin (CMS HCC) 04/30/2006    Migraine without aura 04/30/2006    Dyspnea 03/27/2006    Benign essential hypertension  12/01/2004    Lumbar spondylosis with myelopathy 05/02/2004    Panic disorder without agoraphobia 01/25/2004    Depression 12/06/2000       Family History:  Family History   Problem Relation Age of Onset    Heart Attack Mother     Allergic rhinitis Mother     Food Allergy Mother     COPD Mother     Hypertension (High Blood Pressure) Father     Depression Father     Hypertension (High Blood Pressure) Sister     Asthma Sister     Allergic rhinitis Sister     Drug Allergy Sister     COPD Sister     Hypertension (High Blood Pressure) Brother     Heart Attack Maternal Grandmother        Medications:  Current Outpatient Medications   Medication Sig    amitriptyline (ELAVIL) 25 mg Oral Tablet Take 2 Tablets (50 mg total) by mouth Every night    aspirin 81 mg Oral Tablet, Chewable Chew 1 Tablet (81 mg total) Daily    atorvastatin (LIPITOR) 40 mg Oral Tablet Take 1 Tablet (40 mg total) by mouth Daily    calcium carb/mag carb/folic ac (MAGNESIUM-CALCIUM-FOLIC ACID ORAL) magnesium Take No date recorded No form recorded No frequency recorded No route recorded No set duration recorded No set duration amount recorded active No dosage strength recorded No dosage strength units of measure recorded (Patient not taking: Reported on 02/06/2022)    CALCIUM-MAGNESIUM-ZINC ORAL Take by mouth Twice daily    cetirizine (ZYRTEC) 10 mg Oral Tablet Take 1 Tablet (10 mg total) by mouth Daily    clonazePAM (KLONOPIN) 1 mg Oral Tablet Take 1 Tablet (1 mg total) by mouth Twice daily    desvenlafaxine (PRISTIQ) 100 mg Oral Tablet Sustained Release 24 hr Take 1 Tablet (100 mg total) by mouth Daily    desvenlafaxine succinate (PRISTIQ) 50 mg Oral Tablet Sustained Release 24 hr Take 1 Tablet (50 mg total) by mouth Daily    ergocalciferol , vitamin D2, (VITAMIN D2 ORAL) Take 2,000 Units by mouth Once a day    KRILL OIL ORAL Take 1 Each by mouth Every night    labetaloL (NORMODYNE) 300 mg Oral Tablet Take 1 Tablet (300 mg total) by mouth Twice daily     montelukast (SINGULAIR) 10 mg Oral Tablet Take 1 Tablet (10 mg total) by mouth Every night    omeprazole (PRILOSEC) 20 mg Oral Capsule, Delayed Release(E.C.) Take 1 Capsule (20 mg total) by mouth Once a day    ondansetron  (ZOFRAN  ODT) 4 mg Oral Tablet, Rapid Dissolve Take 1 Tablet (4 mg total) by mouth Every 8 hours as needed for Nausea/Vomiting for up to 3 days after IVIG (Patient not taking: Reported on 02/25/2024)        Allergies:  Allergies   Allergen Reactions    Carbamazepine     Vortioxetine  Other Adverse Reaction (Add comment)      Social History:  Social History     Tobacco  Use    Smoking status: Former     Current packs/day: 1.00     Average packs/day: 1 pack/day for 20.0 years (20.0 ttl pk-yrs)     Types: Cigarettes    Smokeless tobacco: Never      Objective:  BP 128/89   Pulse 93   Temp 36.4 C (97.6 F)   Ht 1.524 m (5')   Wt 74.6 kg (164 lb 7.4 oz)   SpO2 96%   BMI 32.12 kg/m     Physical Exam:  General: no acute distress  Eyes: EOMI, Conjunctiva are clear. No discharge or icterus noted.  HENT: Mucous membranes are moist. Nares are without purulent rhinorrhea. Posterior oropharynx is without exudates, thrush or post nasal drip.    TM cone of light seen with no pus noted B/L.    Lungs: Clear to Auscultation B/L, good air exchange  Cardiovascular: Normal rate and regular rhythm. No murmurs, rubs or gallops  Extremities: Atraumatic. No cyanosis. No significant peripheral edema.  Skin: no rashes or lesions  Neurologic: Strength and sensation grossly normal throughout. CNIII-XII intact, Normal gait.    Psychiatric: Affect within normal limits.  Musculoskeletal: Extremities in tact x 4. No joint erythema or edema.      Assessment and Plan:    ICD-10-CM    1. 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  -Blood labs ordered today (external).  -Continue to receive SCIG every weeks.  -A copy of her genetic testing was reviewed with Margaret Strickland today. A print out was sent home with [facility].  -F/u in 6 months    I am scribing for, and in the presence of, Lorrin Rotter, DO, for services provided on 02/25/2024.  Gabriella Johnson, SCRIBE  02/25/2024, 10:57      I personally performed the services described in this documentation, as scribed  in my presence, and it is both accurate and complete.    Continue subcu IgG every week.  Overall good infection control and tolerance.    Patient without purulent rhinorrhea on exam today and no purulent rhinorrhea when blowing her nose in the office today.  Discussed the need to culture if she is having a drug-resistant bacterial infection.  Recommended seeing ENT was closer to home for culture needs.  Discussed with the patient that they would only be able to do a culture if they saw something draining.    Will trend IgG levels today.      Patient was given a copy of her genetic tests and counseled again the results.  Patient has 3 genes of uncertain significance.  None of the genes are associated with her immune deficiencies or overall clinical symptoms.      Follow up in 4-6 months or sooner as needed  Crystalann Korf P. Wenona Hamilton, PhD  Associate Professor  Adult and Pediatric Allergy and Immunology     Lorrin Rotter, DO 02/25/2024, 11:46       Portions of this note may be dictated using voice recognition software. Variances  in spelling and vocabulary are possible and unintentional. Not all errors are caught/corrected. Please notify the [facility] if any discrepancies are noted or if the meaning of any statement is not clear.       Portions of this note may be dictated using voice recognition software. Variances in spelling  and vocabulary are possible and unintentional. Not all errors are caught/corrected. Please notify the [facility] if any discrepancies are noted or if the meaning of any statement is not clear.

## 2024-02-26 ENCOUNTER — Encounter (HOSPITAL_BASED_OUTPATIENT_CLINIC_OR_DEPARTMENT_OTHER): Payer: Self-pay | Admitting: Pediatric Allergy/Immunology

## 2024-02-26 LAB — IMMUNOGLOBULIN A (IGA), SERUM: IMMUNOGLOBULIN A (IGA): 108 mg/dL (ref 85–499)

## 2024-02-26 LAB — IMMUNOGLOBULIN M (IGM), SERUM: IMMUNOGLOBULIN M (IGM): 99 mg/dL (ref 35–242)

## 2024-02-26 LAB — IMMUNOGLOBULIN G (IGG), SERUM: IMMUNOGLOBULIN G (IGG): 1090 mg/dL (ref 610–1616)

## 2024-04-08 ENCOUNTER — Encounter (HOSPITAL_BASED_OUTPATIENT_CLINIC_OR_DEPARTMENT_OTHER): Payer: Self-pay | Admitting: Pediatric Allergy/Immunology

## 2024-05-05 ENCOUNTER — Encounter (HOSPITAL_COMMUNITY): Payer: Self-pay

## 2024-05-06 ENCOUNTER — Encounter (HOSPITAL_COMMUNITY): Payer: Self-pay

## 2024-07-10 NOTE — Telephone Encounter (Signed)
 Regarding: Clinical Question  ----- Message from Lauraine RAMAN sent at 07/08/2024  8:31 AM EDT -----  Copied From CRM [id number].Shaylynn - [facility] (Pharmacy) called with a clinical question.     Shaylynn called in following up on a prescription request for patient. Stating they faxed an order over on September 12th. Verified correct fax number.     Please call to discuss [phone] option 2    Fax# [phone]

## 2024-07-10 NOTE — Telephone Encounter (Signed)
 Refill order for SCIG faxed to [facility] successfully.   Maryalyce Sanjuan, RN

## 2024-07-13 ENCOUNTER — Encounter (HOSPITAL_BASED_OUTPATIENT_CLINIC_OR_DEPARTMENT_OTHER): Payer: Self-pay | Admitting: Pediatric Allergy/Immunology

## 2024-07-14 ENCOUNTER — Ambulatory Visit (HOSPITAL_BASED_OUTPATIENT_CLINIC_OR_DEPARTMENT_OTHER): Payer: Self-pay | Admitting: Pediatric Allergy/Immunology

## 2024-07-14 NOTE — Telephone Encounter (Signed)
 Received refill request for Cutaquig via fax from [facility]. Orders confirmed, signed, then faxed back to [facility] successfully.   Janie Capp, RN

## 2024-08-14 ENCOUNTER — Encounter (HOSPITAL_BASED_OUTPATIENT_CLINIC_OR_DEPARTMENT_OTHER): Payer: Self-pay | Admitting: Pediatric Allergy/Immunology

## 2024-08-14 ENCOUNTER — Other Ambulatory Visit (HOSPITAL_BASED_OUTPATIENT_CLINIC_OR_DEPARTMENT_OTHER): Payer: Self-pay | Admitting: Pediatric Allergy/Immunology

## 2024-08-14 DIAGNOSIS — D830 Common variable immunodeficiency with predominant abnormalities of B-cell numbers and function: Secondary | ICD-10-CM

## 2024-08-14 DIAGNOSIS — D8989 Other specified disorders involving the immune mechanism, not elsewhere classified: Secondary | ICD-10-CM

## 2024-08-28 ENCOUNTER — Ambulatory Visit (HOSPITAL_BASED_OUTPATIENT_CLINIC_OR_DEPARTMENT_OTHER): Payer: Self-pay | Admitting: Pediatric Allergy/Immunology

## 2024-09-09 ENCOUNTER — Ambulatory Visit: Attending: Pediatric Allergy/Immunology | Admitting: Pediatric Allergy/Immunology

## 2024-09-09 ENCOUNTER — Encounter (HOSPITAL_BASED_OUTPATIENT_CLINIC_OR_DEPARTMENT_OTHER): Payer: Self-pay | Admitting: Pediatric Allergy/Immunology

## 2024-09-09 DIAGNOSIS — J019 Acute sinusitis, unspecified: Secondary | ICD-10-CM

## 2024-09-09 DIAGNOSIS — D830 Common variable immunodeficiency with predominant abnormalities of B-cell numbers and function: Secondary | ICD-10-CM

## 2024-09-09 DIAGNOSIS — D8989 Other specified disorders involving the immune mechanism, not elsewhere classified: Secondary | ICD-10-CM

## 2024-09-09 MED ORDER — DOXYCYCLINE HYCLATE 100 MG CAPSULE
100.0000 mg | ORAL_CAPSULE | Freq: Two times a day (BID) | ORAL | 0 refills | Status: AC
Start: 2024-09-09 — End: 2024-09-23

## 2024-09-09 MED ORDER — FLUCONAZOLE 150 MG TABLET
150.0000 mg | ORAL_TABLET | Freq: Once | ORAL | 0 refills | Status: AC
Start: 2024-09-09 — End: 2024-09-09

## 2024-09-09 NOTE — Progress Notes (Unsigned)
 [facility]  [address]  [place] [address]  Operated by [facility]  Video Visit     Name: Margaret Strickland  MRN: [id number]    Date: 09/09/2024  Age: 53 y.o.                            Patient's location: [place] - [place] [address]   Patient/family aware of provider location: Yes  Patient/family consent for video visit: Yes  Interview and observation performed by: Margaret Costain, DO    Chief Complaint: No chief complaint on file.    History of Present Illness:  Margaret Strickland is a 53 y.o. female           Current Outpatient Medications   Medication Sig    amitriptyline (ELAVIL) 25 mg Oral Tablet Take 2 Tablets (50 mg total) by mouth Every night    aspirin 81 mg Oral Tablet, Chewable Chew 1 Tablet (81 mg total) Daily    atorvastatin (LIPITOR) 40 mg Oral Tablet Take 1 Tablet (40 mg total) by mouth Daily    calcium carb/mag carb/folic ac (MAGNESIUM-CALCIUM-FOLIC ACID ORAL) magnesium Take No date recorded No form recorded No frequency recorded No route recorded No set duration recorded No set duration amount recorded active No dosage strength recorded No dosage strength units of measure recorded (Patient not taking: Reported on 02/06/2022)    CALCIUM-MAGNESIUM-ZINC ORAL Take by mouth Twice daily    cetirizine (ZYRTEC) 10 mg Oral Tablet Take 1 Tablet (10 mg total) by mouth Daily    clonazePAM (KLONOPIN) 1 mg Oral Tablet Take 1 Tablet (1 mg total) by mouth Twice daily    desvenlafaxine (PRISTIQ) 100 mg Oral Tablet Sustained Release 24 hr Take 1 Tablet (100 mg total) by mouth Daily    desvenlafaxine succinate (PRISTIQ) 50 mg Oral Tablet Sustained Release 24 hr Take 1 Tablet (50 mg total) by mouth Daily    doxycycline  hyclate (VIBRAMYCIN ) 100 mg Oral Capsule Take 1 Capsule (100 mg total) by mouth Twice daily for 14 days    ergocalciferol , vitamin D2, (VITAMIN D2 ORAL) Take 2,000 Units by mouth Once a day    fluconazole  (DIFLUCAN ) 150 mg Oral Tablet Take 1 Tablet (150 mg  total) by mouth One time for 1 dose    KRILL OIL ORAL Take 1 Each by mouth Every night    labetaloL (NORMODYNE) 300 mg Oral Tablet Take 1 Tablet (300 mg total) by mouth Twice daily    montelukast (SINGULAIR) 10 mg Oral Tablet Take 1 Tablet (10 mg total) by mouth Every night    omeprazole (PRILOSEC) 20 mg Oral Capsule, Delayed Release(E.C.) Take 1 Capsule (20 mg total) by mouth Once a day    ondansetron  (ZOFRAN  ODT) 4 mg Oral Tablet, Rapid Dissolve Take 1 Tablet (4 mg total) by mouth Every 8 hours as needed for Nausea/Vomiting for up to 3 days after IVIG (Patient not taking: Reported on 02/25/2024)     Allergies[1]  Past Medical History:   Diagnosis Date    Allergic rhinitis     Anxiety     Depression     Drug allergy     Essential hypertension     Hyperlipemia     Migraine     Vitamin A deficiency          Past Surgical History:   Procedure Laterality Date    HX APPENDECTOMY      HX  TONSILLECTOMY      HX TUBAL LIGATION           Family Medical History:       Problem Relation (Age of Onset)    Allergic rhinitis Mother, Sister    Asthma Sister    COPD Mother, Sister    Depression Father    Drug Allergy Sister    Food Allergy Mother    Heart Attack Mother, Maternal Grandmother    Hypertension (High Blood Pressure) Father, Sister, Brother            Social History     Socioeconomic History    Marital status: Married   Tobacco Use    Smoking status: Former     Current packs/day: 1.00     Average packs/day: 1 pack/day for 20.0 years (20.0 ttl pk-yrs)     Types: Cigarettes    Smokeless tobacco: Never         Review of Systems:  Constitutional: Denies fevers, chills, or fatigue.   Eyes: Denies irritation, erythema, discharge or pain.   Ears: Denies any ear pain/tugging or discharge.  Nose: See HPI  Mouth/Throat: Denies any oral ulcers or other lesions.   Cardiovascular: Denies any cyanosis, murmurs, periorbital edema   Respiratory: Denies any shortness of breath, wheezing, cough   GI: Denies any abdominal pain, vomiting,  diarrhea or constipation.   Skin: Denies: skin rashes or lesions    Observational Exam:   General: No acute distress  Eyes: EOMI, Conjunctiva are clear. No discharge or icterus noted.  HENT: Mucous membranes are moist. External Nares are without crusted rhinorrhea  Lungs: symmetrical Chest rise b/l  Cardiovascular:  No cyanosis.   Skin: no rashes or lesions on face, neck lower arms or hands  Neurologic: grossly normal.  Psychiatric: Affect within normal limits.    Assessment/Plan:    ICD-10-CM    1. 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 Acute sinus infection  J01.90         Orders Placed This Encounter    doxycycline  hyclate (VIBRAMYCIN ) 100 mg Oral Capsule    fluconazole  (DIFLUCAN ) 150 mg Oral Tablet     Tolerating SCIG well at home. Infection during SCIG lapse in treatment.    -Continue SCIG every weeks.    Doxy 100  BID  and diflucan  150mg  if needed       -F/u in 6 months      Margaret Costain, DO         [1]   Allergies  Allergen Reactions    Carbamazepine     Vortioxetine  Other Adverse Reaction (Add comment)

## 2024-09-15 ENCOUNTER — Encounter (HOSPITAL_COMMUNITY): Payer: Self-pay

## 2024-09-25 ENCOUNTER — Encounter (HOSPITAL_BASED_OUTPATIENT_CLINIC_OR_DEPARTMENT_OTHER): Payer: Self-pay | Admitting: Pediatric Allergy/Immunology

## 2024-10-09 ENCOUNTER — Encounter (HOSPITAL_BASED_OUTPATIENT_CLINIC_OR_DEPARTMENT_OTHER): Payer: Self-pay | Admitting: Pediatric Allergy/Immunology

## 2024-10-09 ENCOUNTER — Ambulatory Visit (HOSPITAL_BASED_OUTPATIENT_CLINIC_OR_DEPARTMENT_OTHER): Payer: Self-pay | Admitting: Pediatric Allergy/Immunology

## 2024-10-09 NOTE — Telephone Encounter (Signed)
 Regarding: Clinical Question  ----- Message from Berwyn DEL sent at 10/08/2024 11:24 AM EST -----  Copied From CRM [id number].  Luckett, Ronal Crank (Self) called with a clinical question.     Damonica calling stating that she got a letter from her ins stating that they need the med ( she mentioned not on med list) resubmitted for a prior auth for next year  Please call if need to discuss

## 2024-10-09 NOTE — Nursing Note (Signed)
 Attempted to call patient to confirm which medication needed a prior authorization. Left a message stating for patient to give me a call back when able to.     Marry Pool, RN

## 2024-10-09 NOTE — Telephone Encounter (Addendum)
 Spoke with Adina Phlegm, PHARMD, from [facility] who states patient's Cutaquig authorization is approved until 12/03/24 and that they will start working on the reauthorization on or after 10/23/24. Patient made aware of this via MyChart message.    Norlene Norma, RN      Regarding: Clinical Question  ----- Message from Waddell DEL sent at 10/09/2024  4:09 PM EST -----  Copied From CRM #[id number].Peppers pt  Strickland, Margaret Millar (Self) called requesting to speak with Gabby regarding prior auth for medication Cutaquig. Please call. Thanks.

## 2024-10-17 ENCOUNTER — Other Ambulatory Visit: Payer: Self-pay

## 2024-10-17 ENCOUNTER — Ambulatory Visit: Attending: Pediatric Allergy/Immunology

## 2024-10-17 DIAGNOSIS — D830 Common variable immunodeficiency with predominant abnormalities of B-cell numbers and function: Secondary | ICD-10-CM | POA: Insufficient documentation

## 2024-10-17 DIAGNOSIS — D8989 Other specified disorders involving the immune mechanism, not elsewhere classified: Secondary | ICD-10-CM | POA: Insufficient documentation

## 2024-10-18 LAB — IMMUNOGLOBULIN A (IGA), SERUM: IMMUNOGLOBULIN A (IGA): 99 mg/dL (ref 85–499)

## 2024-10-18 LAB — IMMUNOGLOBULIN M (IGM), SERUM: IMMUNOGLOBULIN M (IGM): 63 mg/dL (ref 35–242)

## 2024-10-18 LAB — IMMUNOGLOBULIN G (IGG), SERUM: IMMUNOGLOBULIN G (IGG): 1193 mg/dL (ref 610–1616)

## 2025-04-10 ENCOUNTER — Ambulatory Visit (HOSPITAL_BASED_OUTPATIENT_CLINIC_OR_DEPARTMENT_OTHER): Payer: Self-pay | Admitting: Pediatric Allergy/Immunology

## 2025-04-14 ENCOUNTER — Ambulatory Visit (HOSPITAL_BASED_OUTPATIENT_CLINIC_OR_DEPARTMENT_OTHER): Admitting: Pediatric Allergy/Immunology
